# Patient Record
Sex: Female | Born: 1965 | Race: Asian | Hispanic: No | Marital: Married | Smoking: Never smoker
Health system: Southern US, Community
[De-identification: ages and names within clinical notes are randomized; demographics above are authoritative.]

## PROBLEM LIST (undated history)

## (undated) ENCOUNTER — Emergency Department (HOSPITAL_COMMUNITY): Admission: EM | Discharge status: Absent without leave | Payer: 59

## (undated) DIAGNOSIS — K219 Gastro-esophageal reflux disease without esophagitis: Secondary | ICD-10-CM

## (undated) DIAGNOSIS — E0590 Thyrotoxicosis, unspecified without thyrotoxic crisis or storm: Secondary | ICD-10-CM

## (undated) DIAGNOSIS — E039 Hypothyroidism, unspecified: Secondary | ICD-10-CM

## (undated) DIAGNOSIS — G43909 Migraine, unspecified, not intractable, without status migrainosus: Secondary | ICD-10-CM

## (undated) DIAGNOSIS — C73 Malignant neoplasm of thyroid gland: Secondary | ICD-10-CM

## (undated) DIAGNOSIS — Z5189 Encounter for other specified aftercare: Secondary | ICD-10-CM

## (undated) DIAGNOSIS — R768 Other specified abnormal immunological findings in serum: Secondary | ICD-10-CM

## (undated) HISTORY — PX: THYROIDECTOMY: SHX17

## (undated) HISTORY — DX: Other specified abnormal immunological findings in serum: R76.8

## (undated) HISTORY — DX: Malignant neoplasm of thyroid gland: C73

---

## 1998-04-20 DIAGNOSIS — IMO0001 Reserved for inherently not codable concepts without codable children: Secondary | ICD-10-CM

## 1998-04-20 DIAGNOSIS — Z5189 Encounter for other specified aftercare: Secondary | ICD-10-CM

## 1998-04-20 HISTORY — DX: Encounter for other specified aftercare: Z51.89

## 1998-04-20 HISTORY — DX: Reserved for inherently not codable concepts without codable children: IMO0001

## 1999-11-06 ENCOUNTER — Encounter: Payer: Self-pay | Admitting: Infectious Diseases

## 1999-11-06 ENCOUNTER — Ambulatory Visit (HOSPITAL_COMMUNITY): Admission: RE | Admit: 1999-11-06 | Discharge: 1999-11-06 | Payer: Self-pay | Admitting: Infectious Diseases

## 2000-02-24 ENCOUNTER — Other Ambulatory Visit: Admission: RE | Admit: 2000-02-24 | Discharge: 2000-02-24 | Payer: Self-pay | Admitting: Internal Medicine

## 2000-03-12 ENCOUNTER — Emergency Department (HOSPITAL_COMMUNITY): Admission: EM | Admit: 2000-03-12 | Discharge: 2000-03-13 | Payer: Self-pay | Admitting: Emergency Medicine

## 2000-03-23 ENCOUNTER — Encounter: Payer: Self-pay | Admitting: Obstetrics and Gynecology

## 2000-03-23 ENCOUNTER — Encounter: Admission: RE | Admit: 2000-03-23 | Discharge: 2000-03-23 | Payer: Self-pay | Admitting: Obstetrics and Gynecology

## 2000-04-20 HISTORY — PX: DILATION AND CURETTAGE, DIAGNOSTIC / THERAPEUTIC: SUR384

## 2000-04-20 HISTORY — PX: MYOMECTOMY: SHX85

## 2000-05-06 ENCOUNTER — Ambulatory Visit (HOSPITAL_COMMUNITY): Admission: RE | Admit: 2000-05-06 | Discharge: 2000-05-06 | Payer: Self-pay | Admitting: Obstetrics and Gynecology

## 2000-05-06 ENCOUNTER — Encounter (INDEPENDENT_AMBULATORY_CARE_PROVIDER_SITE_OTHER): Payer: Self-pay | Admitting: Specialist

## 2001-11-17 ENCOUNTER — Other Ambulatory Visit: Admission: RE | Admit: 2001-11-17 | Discharge: 2001-11-17 | Payer: Self-pay | Admitting: Obstetrics and Gynecology

## 2002-11-16 ENCOUNTER — Other Ambulatory Visit: Admission: RE | Admit: 2002-11-16 | Discharge: 2002-11-16 | Payer: Self-pay | Admitting: Obstetrics and Gynecology

## 2003-03-08 ENCOUNTER — Encounter: Admission: RE | Admit: 2003-03-08 | Discharge: 2003-03-08 | Payer: Self-pay | Admitting: Family Medicine

## 2003-08-06 ENCOUNTER — Encounter (HOSPITAL_COMMUNITY): Admission: RE | Admit: 2003-08-06 | Discharge: 2003-11-04 | Payer: Self-pay | Admitting: Endocrinology

## 2003-12-05 ENCOUNTER — Other Ambulatory Visit: Admission: RE | Admit: 2003-12-05 | Discharge: 2003-12-05 | Payer: Self-pay | Admitting: Obstetrics and Gynecology

## 2004-04-20 DIAGNOSIS — C73 Malignant neoplasm of thyroid gland: Secondary | ICD-10-CM

## 2004-04-20 HISTORY — DX: Malignant neoplasm of thyroid gland: C73

## 2004-08-13 ENCOUNTER — Ambulatory Visit (HOSPITAL_COMMUNITY): Admission: RE | Admit: 2004-08-13 | Discharge: 2004-08-13 | Payer: Self-pay | Admitting: Endocrinology

## 2004-09-10 ENCOUNTER — Encounter: Admission: RE | Admit: 2004-09-10 | Discharge: 2004-09-10 | Payer: Self-pay | Admitting: Endocrinology

## 2004-09-10 ENCOUNTER — Other Ambulatory Visit: Admission: RE | Admit: 2004-09-10 | Discharge: 2004-09-10 | Payer: Self-pay | Admitting: Interventional Radiology

## 2004-09-10 ENCOUNTER — Encounter (INDEPENDENT_AMBULATORY_CARE_PROVIDER_SITE_OTHER): Payer: Self-pay | Admitting: Specialist

## 2004-10-20 ENCOUNTER — Encounter (INDEPENDENT_AMBULATORY_CARE_PROVIDER_SITE_OTHER): Payer: Self-pay | Admitting: *Deleted

## 2004-10-20 ENCOUNTER — Ambulatory Visit (HOSPITAL_COMMUNITY): Admission: RE | Admit: 2004-10-20 | Discharge: 2004-10-22 | Payer: Self-pay | Admitting: Surgery

## 2004-11-17 ENCOUNTER — Encounter (HOSPITAL_COMMUNITY): Admission: RE | Admit: 2004-11-17 | Discharge: 2005-02-15 | Payer: Self-pay | Admitting: Endocrinology

## 2004-12-11 ENCOUNTER — Other Ambulatory Visit: Admission: RE | Admit: 2004-12-11 | Discharge: 2004-12-11 | Payer: Self-pay | Admitting: Obstetrics and Gynecology

## 2005-12-28 ENCOUNTER — Encounter (HOSPITAL_COMMUNITY): Admission: RE | Admit: 2005-12-28 | Discharge: 2006-03-28 | Payer: Self-pay | Admitting: Endocrinology

## 2006-01-19 ENCOUNTER — Other Ambulatory Visit: Admission: RE | Admit: 2006-01-19 | Discharge: 2006-01-19 | Payer: Self-pay | Admitting: Obstetrics and Gynecology

## 2006-08-19 IMAGING — CR DG CHEST 2V
2 series · 2 of 2 positions shown · non-contrast
Comparison: none

CLINICAL DATA: Papillary thyroid cancer.  Staging and pre-op respiratory evaluation.
 CHEST - 2 VIEW: 
 The heart size and mediastinal contours are normal.  The lungs are clear.  The visualized skeleton is unremarkable.

[view not recorded (1 of 2)]
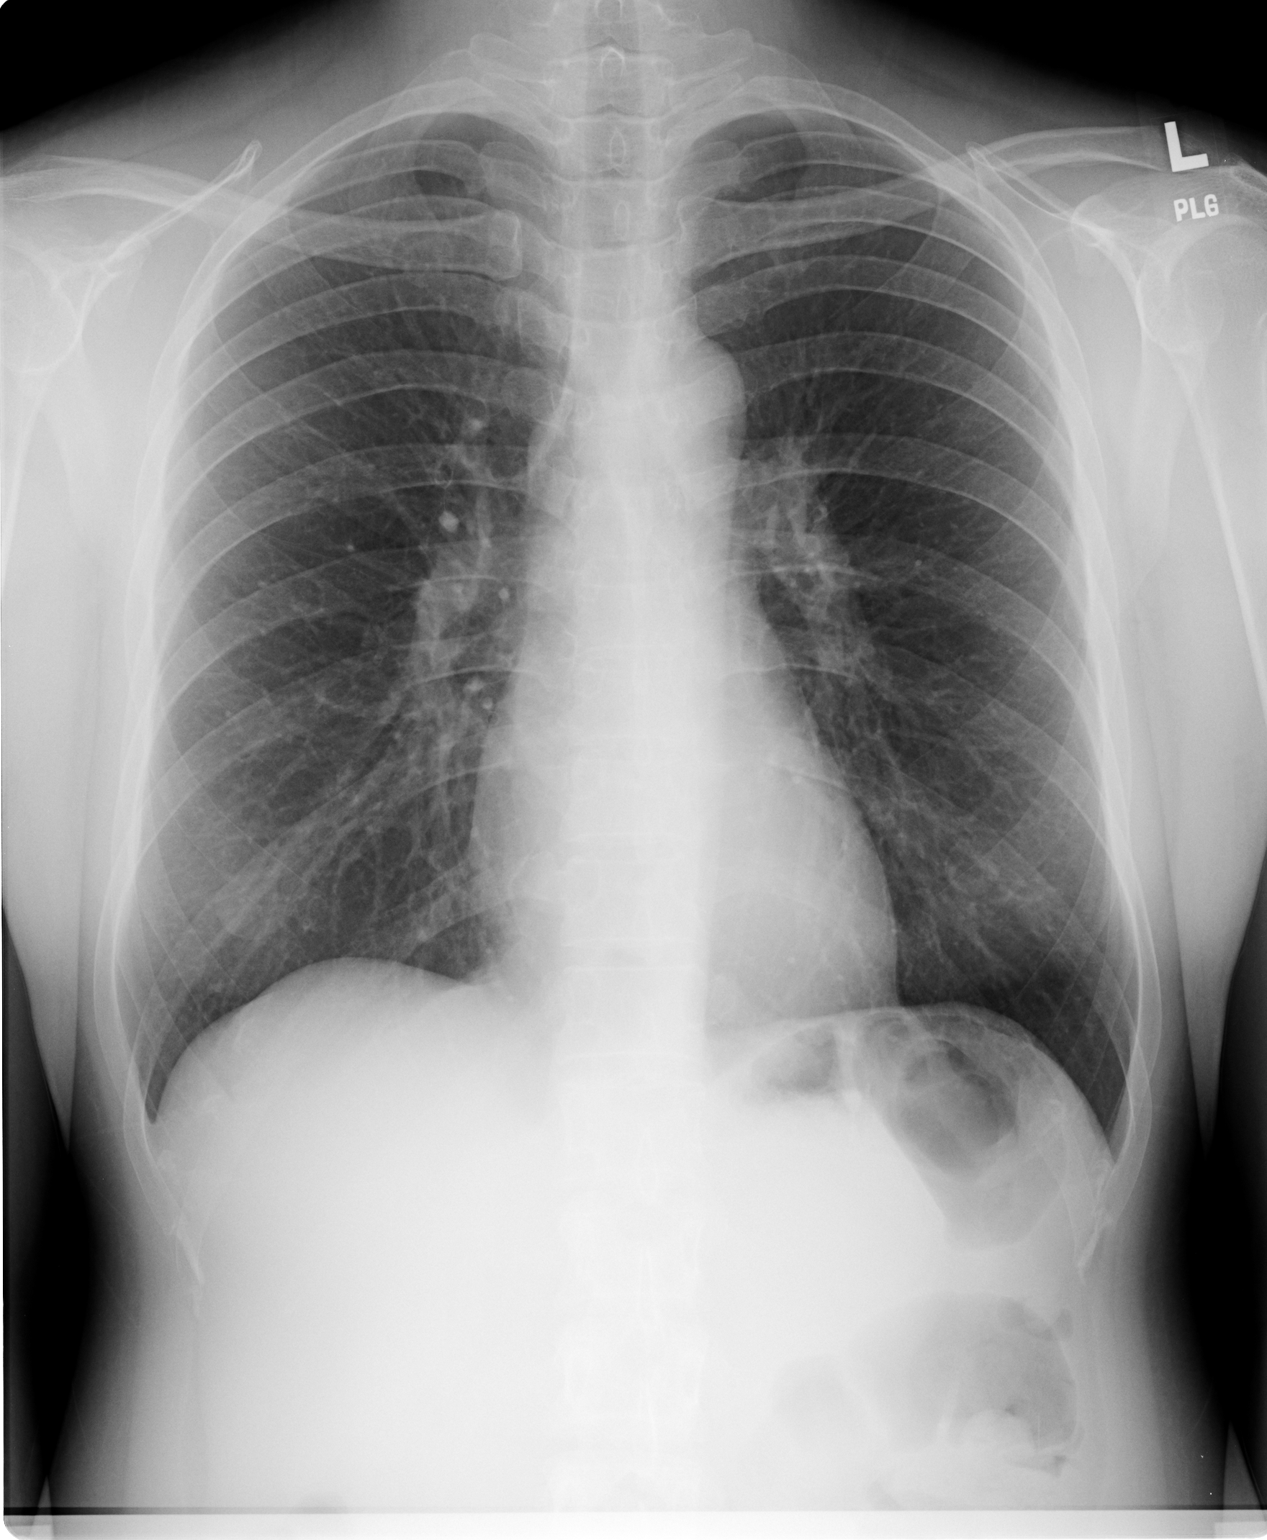

[view not recorded (2 of 2)]
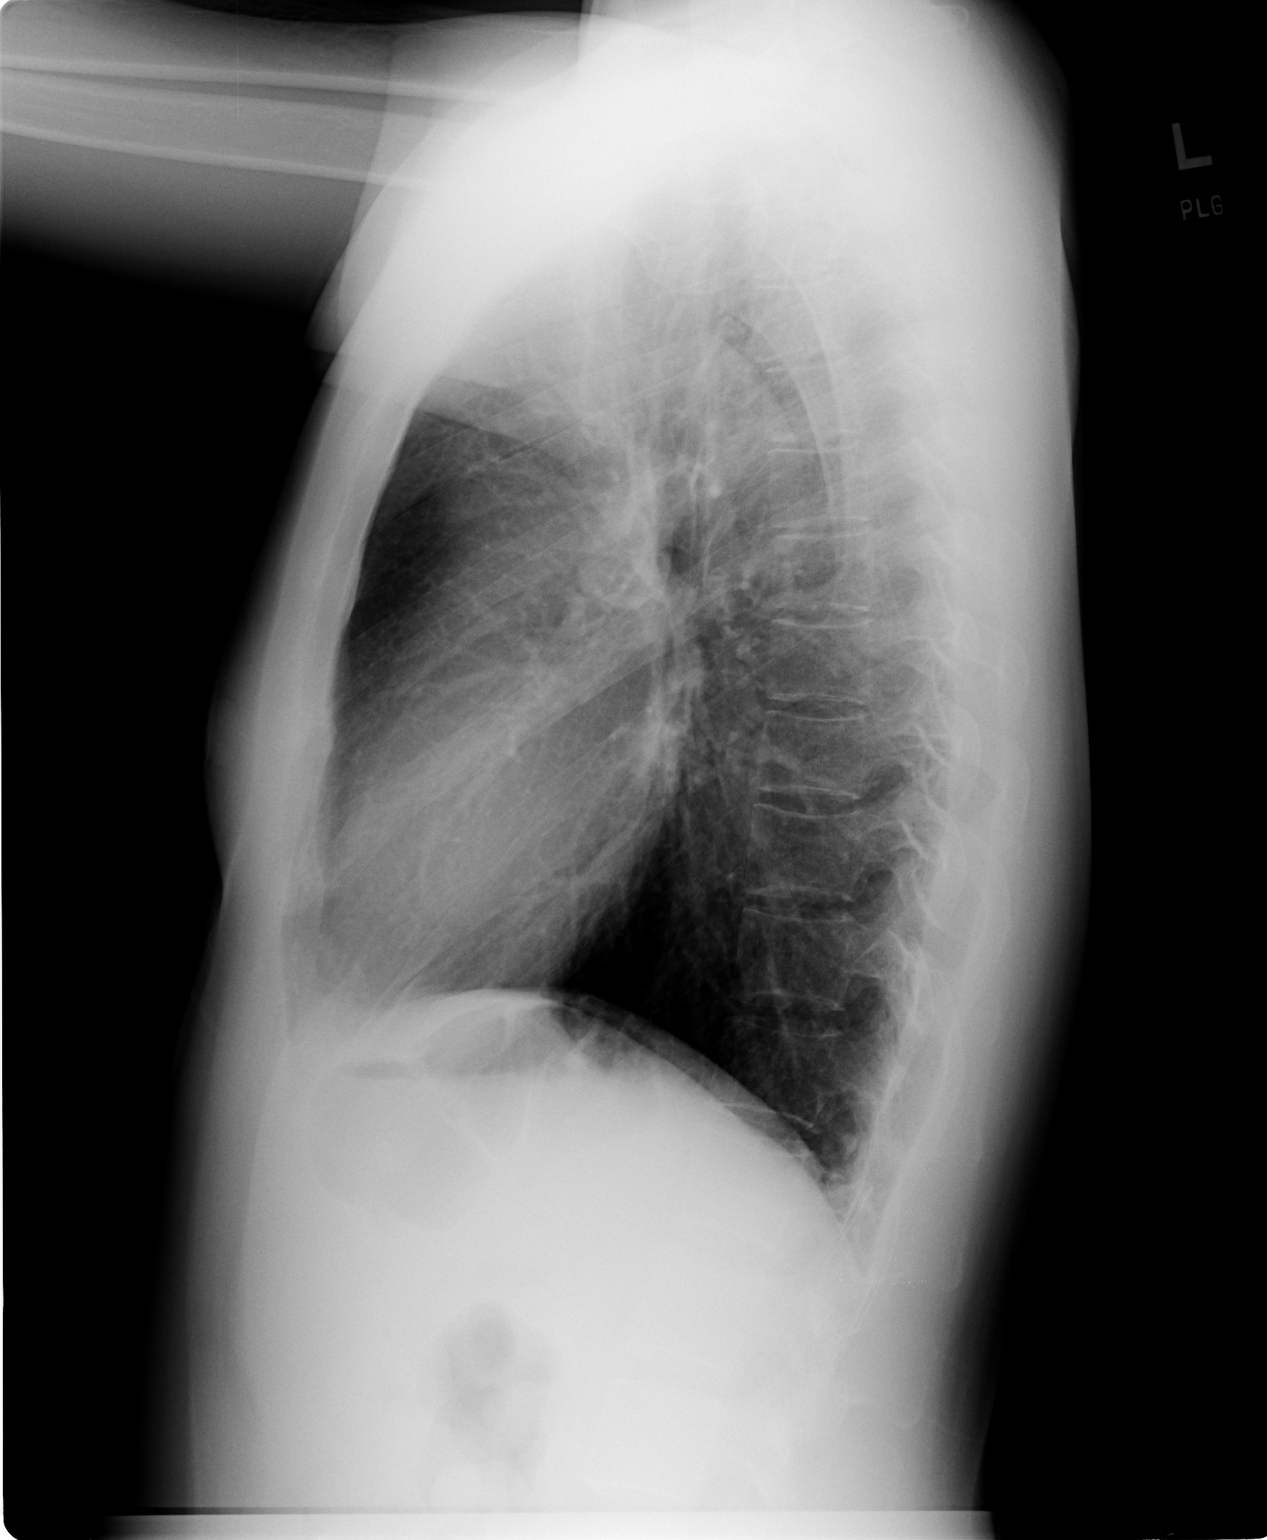

[2 of 2 positions shown; findings below may reference images not displayed]

IMPRESSION: No active disease.

## 2007-01-24 ENCOUNTER — Other Ambulatory Visit: Admission: RE | Admit: 2007-01-24 | Discharge: 2007-01-24 | Payer: Self-pay | Admitting: Obstetrics and Gynecology

## 2007-02-16 ENCOUNTER — Encounter (INDEPENDENT_AMBULATORY_CARE_PROVIDER_SITE_OTHER): Payer: Self-pay | Admitting: Obstetrics and Gynecology

## 2007-02-16 ENCOUNTER — Ambulatory Visit (HOSPITAL_COMMUNITY): Admission: RE | Admit: 2007-02-16 | Discharge: 2007-02-16 | Payer: Self-pay | Admitting: Obstetrics and Gynecology

## 2007-04-21 HISTORY — PX: BREAST LUMPECTOMY: SHX2

## 2007-07-08 ENCOUNTER — Encounter: Admission: RE | Admit: 2007-07-08 | Discharge: 2007-07-08 | Payer: Self-pay | Admitting: Family Medicine

## 2007-08-29 ENCOUNTER — Ambulatory Visit (HOSPITAL_BASED_OUTPATIENT_CLINIC_OR_DEPARTMENT_OTHER): Admission: RE | Admit: 2007-08-29 | Discharge: 2007-08-29 | Payer: Self-pay | Admitting: General Surgery

## 2007-08-29 ENCOUNTER — Encounter (HOSPITAL_BASED_OUTPATIENT_CLINIC_OR_DEPARTMENT_OTHER): Payer: Self-pay | Admitting: General Surgery

## 2007-09-30 ENCOUNTER — Emergency Department (HOSPITAL_COMMUNITY): Admission: EM | Admit: 2007-09-30 | Discharge: 2007-09-30 | Payer: Self-pay | Admitting: Emergency Medicine

## 2007-11-11 ENCOUNTER — Ambulatory Visit (HOSPITAL_COMMUNITY): Admission: RE | Admit: 2007-11-11 | Discharge: 2007-11-11 | Payer: Self-pay | Admitting: Obstetrics & Gynecology

## 2007-11-11 ENCOUNTER — Encounter (INDEPENDENT_AMBULATORY_CARE_PROVIDER_SITE_OTHER): Payer: Self-pay | Admitting: Obstetrics & Gynecology

## 2008-02-15 ENCOUNTER — Encounter: Admission: RE | Admit: 2008-02-15 | Discharge: 2008-02-15 | Payer: Self-pay | Admitting: General Surgery

## 2009-02-26 ENCOUNTER — Encounter: Admission: RE | Admit: 2009-02-26 | Discharge: 2009-02-26 | Payer: Self-pay | Admitting: Family Medicine

## 2009-03-08 ENCOUNTER — Encounter: Admission: RE | Admit: 2009-03-08 | Discharge: 2009-03-08 | Payer: Self-pay | Admitting: Family Medicine

## 2009-10-03 ENCOUNTER — Ambulatory Visit (HOSPITAL_BASED_OUTPATIENT_CLINIC_OR_DEPARTMENT_OTHER): Admission: RE | Admit: 2009-10-03 | Discharge: 2009-10-03 | Payer: Self-pay | Admitting: Surgery

## 2009-11-04 ENCOUNTER — Encounter (HOSPITAL_COMMUNITY): Admission: RE | Admit: 2009-11-04 | Discharge: 2010-01-07 | Payer: Self-pay | Admitting: Endocrinology

## 2009-11-10 ENCOUNTER — Emergency Department (HOSPITAL_BASED_OUTPATIENT_CLINIC_OR_DEPARTMENT_OTHER): Admission: EM | Admit: 2009-11-10 | Discharge: 2009-11-10 | Payer: Self-pay | Admitting: Emergency Medicine

## 2010-05-11 ENCOUNTER — Encounter: Payer: Self-pay | Admitting: Endocrinology

## 2010-06-24 ENCOUNTER — Other Ambulatory Visit: Payer: Self-pay | Admitting: Gynecology

## 2010-07-05 LAB — CBC
HCT: 34 % — ABNORMAL LOW (ref 36.0–46.0)
MCHC: 32.6 g/dL (ref 30.0–36.0)
Platelets: 362 10*3/uL (ref 150–400)
RDW: 14.2 % (ref 11.5–15.5)

## 2010-07-05 LAB — PREGNANCY, URINE: Preg Test, Ur: NEGATIVE

## 2010-07-05 LAB — URINALYSIS, ROUTINE W REFLEX MICROSCOPIC
Ketones, ur: NEGATIVE mg/dL
pH: 5.5 (ref 5.0–8.0)

## 2010-07-05 LAB — BASIC METABOLIC PANEL
BUN: 7 mg/dL (ref 6–23)
CO2: 25 mEq/L (ref 19–32)
GFR calc Af Amer: >60 mL/min (ref >60)
GFR calc non Af Amer: >60 mL/min (ref >60)
Glucose, Bld: 120 mg/dL — ABNORMAL HIGH (ref 70–99)

## 2010-07-05 LAB — HCG, SERUM, QUALITATIVE

## 2010-09-02 NOTE — Op Note (Signed)
Jasmine [name], Jasmine [name]             ACCOUNT NO.:  [id number]   MEDICAL RECORD NO.:  [id number]          PATIENT TYPE:  AMB   LOCATION:  SDC                           [facility]   PHYSICIAN:  Ilda Mori, M.D.   DATE OF BIRTH:  10-15-1965   DATE OF PROCEDURE:  11/11/2007  DATE OF DISCHARGE:                               OPERATIVE REPORT   PREOPERATIVE DIAGNOSIS:  Missed abortion fetal demise at 9 weeks.   POSTOPERATIVE DIAGNOSIS:  Missed abortion fetal demise at 9 weeks.   PROCEDURE:  Dilatation and evacuation.   SURGEON:  Ilda Mori, MD   ANESTHESIA:  MAC with paracervical block.   FINDINGS:  Products of conception compatible with missed AB and old  hemorrhage.   SPECIMEN:  To pathology and to [facility] for chromosomal  analysis of a portion of the POC.   COMPLICATIONS:  None.   BLOOD LOSS:  Minimal.   INDICATIONS:  This is a 45 year old primigravid female who had a  ultrasound showing a viable fetus 10 days prior to admission.  Three  days prior to mission, the patient returned with a history of some  bleeding and a followup ultrasound revealed a fetal demise with no fetal  heart tones seen.  The patient returns this morning for confirmatory  ultrasound and for dilatation evacuation.  An ultrasound done this  morning confirmed a fetal demise and the patient elected to proceed with  dilatation and evacuation.   PROCEDURE:  The patient was brought to the operating room and IV  sedation was administered.  She was placed in a dorsal lithotomy  position.  The vulva and vagina were prepped and draped in sterile  fashion.  The anterior lip of the cervix was grasped with a single-tooth  tenaculum and 10 mL of 2% lidocaine was infiltrated in the paracervical  tissues.  The internal os was dilated with Shawnie Pons dilators to a 30-  [place].  A #9 suction curette was introduced into the endometrial cavity  and the products of conception were evacuated.  Portion of the  products  of conception were sent for chromosomal analysis.  Procedure was then  terminated and the patient left the operating room in good condition.      Ilda Mori, M.D.  Electronically Signed     RK/MEDQ  D:  11/11/2007  T:  11/11/2007  Job:  [id number]

## 2010-09-02 NOTE — Op Note (Signed)
Jasmine [name], Jasmine [name]             ACCOUNT NO.:  [id number]   MEDICAL RECORD NO.:  [id number]          PATIENT TYPE:  AMB   LOCATION:  DSC                          FACILITY:  [facility]   PHYSICIAN:  Leonie Man, M.D.   DATE OF BIRTH:  26-Nov-1965   DATE OF PROCEDURE:  08/29/2007  DATE OF DISCHARGE:                               OPERATIVE REPORT   PREOPERATIVE DIAGNOSIS:  Fibroadenoma of the left breast.   POSTOPERATIVE DIAGNOSIS:  Fibroadenoma of the left breast.   PROCEDURE:  Excisional biopsy of left breast mass.   SURGEON:  Leonie Man, M.D.   ASSISTANT:  OR nurse.   ANESTHESIA:  General.   Ms. Gambino is a 45 year old female with a self-discovered left breast  mass located at approximately two fingerbreadths superior to the areolar  border on the left side.  This is imaged on ultrasound and is consistent  with the fibroadenoma.  The patient comes to the operating room now  after risks and potential benefits of surgery have been fully discussed.  All questions answered and consent obtained.   PROCEDURE:  Following the induction of satisfactory general anesthesia,  the left breast was prepped and draped to be included in the sterile  operative field.  Positive identification of the patient, [name], and the procedure be done, excisional biopsy of the left  breast was carried out.  I made a circumareolar incision along the  superior border of the areola deepened through the skin and subcutaneous  tissue carrying the dissection superiorly and medially towards the  region of the mass maintaining hemostasis.  The mass was grasped and  dissected free from the surrounding breast tissues.  This comprised of  cluster of approximately four small fibroadenomas.  This was removed in  its entirety and forwarded for pathologic evaluation.  Hemostasis was  secured with electrocautery.  Sponge and instrument counts were  verified.  The breast tissues were reapproximated with  3-0 Vicryl.  Subcutaneous tissues were closed with 3-0 Vicryl sutures.  Skin was  closed with 5-0 Monocryl suture and reinforced with Steri-Strips.  Sterile dressings were applied.  Anesthetic reversed.  The patient  removed from the operating room to the recovery room in stable  condition.  She tolerated the procedure well.      Leonie Man, M.D.  Electronically Signed     PB/MEDQ  D:  08/29/2007  T:  08/29/2007  Job:  [id number]

## 2010-09-02 NOTE — Op Note (Signed)
[name], [name]             ACCOUNT NO.:  [id number]   MEDICAL RECORD NO.:  [id number]          PATIENT TYPE:  AMB   LOCATION:  SDC                           [facility]   PHYSICIAN:  James A. Ashley Royalty, M.D.DATE OF BIRTH:  September 25, 1965   DATE OF PROCEDURE:  02/16/2007  DATE OF DISCHARGE:                               OPERATIVE REPORT   PREOPERATIVE DIAGNOSIS:  1. Endometrial polyp and/or submucosal fibroid.  2. Menorrhagia.   POSTOPERATIVE DIAGNOSIS:  Apparent submucosal fibroids (3).   PROCEDURE:  1. Diagnostic/operative hysteroscopy with resectoscopic myomectomies      (3).  2. Dilatation and curettage.   SURGEON:  Rudy Jew. Ashley Royalty, MD.   ANESTHESIA:  General.   ESTIMATED BLOOD LOSS:  50 mL, deficit 150 mL.   COMPLICATIONS:  None.   PACKS AND DRAINS:  None.   DESCRIPTION OF PROCEDURE:  The patient was taken to the operating room  and placed in the dorsal supine position.  After general anesthesia was  administered, she was placed in the lithotomy position, prepped and  draped in the usual manner for vaginal surgery. A posterior weighted  retractor was placed per vagina.  The anterior lip of the cervix was  grasped with a single-tooth tenacula.  The uterus was gently sounded to  approximately 10 cm and noted to be slightly retroverted.  The cervix  was then dilated to a size 27-French using Shawnie Pons dilators.  The  resectoscope was placed into the uterine cavity using sorbitol as a  distention medium.  After appropriate distention was obtained, the  uterine cavity was completely visualized.  There was a pedunculated  fibroid arising from the fundus slightly to the right of midline. In  addition, there was a fundal left-sided sessile submucosal fibroid.  Finally, there was a very small posterior submucosal fibroid slightly  inferior to the left side of the lateral one but still in the uterine  corpus.  The fallopian tube ostia were visualized bilaterally.  Appropriate photos were obtained.  Using the resectoscope, the left-  sided sessile fibroid was set in a piecemeal fashion until the contour  was continuous with the remainder of the uterine cavity.  Hemostasis was  obtained with the coagulation waveform.  Next the fundal pedunculated  fibroid was easily excised with the cutting waveform. It was submitted  separately to pathology for histologic studies.  Finally the small  posterior submucosal fibroid was excised and submitted in the same  container as the pieces from the left-sided fibroid.  Hemostasis was  obtained with the coagulation waveform.   Attention was then turned to the uterine curettage.  A medium-size  curette was introduced into the uterine cavity.  First a four quadrant  curettage was performed.  Then a therapeutic curettage was performed.  All curettings were submitted to pathology separately for histologic  studies.   At this point, the patient was felt to have benefited maximally from the  surgical procedure.  The vaginal instruments were removed.  There was a  small laceration at the tenaculum site on the cervix which was easily  closed with 2-0 chromic in  a figure-of-eight fashion.  Hemostasis was  noted and the procedure terminated.   The patient tolerated the procedure extremely well and was returned to  the recovery room in good condition.      James A. Ashley Royalty, M.D.  Electronically Signed     JAM/MEDQ  D:  02/16/2007  T:  02/17/2007  Job:  [id number]

## 2010-09-02 NOTE — H&P (Signed)
Jasmine [name], Jasmine [name]            ACCOUNT NO.:  [id number]   MEDICAL RECORD NO.:  [id number]           PATIENT TYPE:   LOCATION:                                [facility]   PHYSICIAN:  James A. Ashley Royalty, M.D.DATE OF BIRTH:  March 05, 1966   DATE OF ADMISSION:  DATE OF DISCHARGE:                              HISTORY & PHYSICAL   This is a 45 year old nulligravida who presented to me January 24, 2007  complaining of menorrhagia.  Pelvic examination revealed a nodular  uterus.  She subsequently underwent an ultrasound and sonohystogram.  The ultrasound performed February 10, 2007 revealed the uterus to be 9 x  7 x 6 cm with several small fibroids the largest of which were 2.8 and  2.5 cm in greatest diameter, respectively.  Adnexal areas were  unremarkable.  Sonohystogram was performed and revealed an apparent 1.8  cm polyp and an approximately 1.2 cm fibroid which appeared to have a  submucosal component.  She is scheduled for diagnostic/operative  hysteroscopy and dilatation and curettage.   MEDICATIONS:  Mircette, Synthroid 150 mcg daily.   PAST MEDICAL HISTORY:  1. History of thyroid cancer for which she received thyroidectomy and      radioactive iodine.  2. Secondary hypothyroidism.  3. Menstrual migraines.   SURGICAL HISTORY:  As above.   ALLERGIES:  None.   FAMILY HISTORY:  Noncontributory.   SOCIAL HISTORY:  The patient denies use of tobacco or significant  alcohol.   REVIEW OF SYSTEMS:  Noncontributory.   PHYSICAL EXAMINATION:  GENERAL:  Well-developed, well-nourished pleasant  white female in no acute distress.  VITAL SIGNS:  Afebrile, vital signs stable.  CHEST/LUNGS:  Clear.  CARDIAC:  Regular rate and rhythm.  BREASTS:  Exam deferred.  ABDOMEN:  Soft, nontender.  MUSCULOSKELETAL:  Reveals no edema.  PELVIC:  Please see most recent office evaluation.  External genitalia  within normal limits.  Vagina and cervix are without mass or lesions.  Bimanual  examination revealed uterus to be approximately 8 weeks size  and slightly nodular.  Rectovaginal examination confirmed.   IMPRESSION:  1. Fibroid uterus.  2. Possible endometrial polyp.  3. Menorrhagia - possibly secondary to #1 and/or #2.  4. History of thyroid cancer.  5. Secondary hypothyroidism.   PLAN:  1. Diagnostic/operative hysteroscopy.  2. Dilatation and curettage.  Risks, benefits, complications, and      alternatives were discussed with the patient.  She states she      understands and accepts.  Questions invited and answered.      James A. Ashley Royalty, M.D.  Electronically Signed     JAM/MEDQ  D:  02/16/2007  T:  02/16/2007  Job:  [id number]

## 2010-09-05 NOTE — H&P (Signed)
[facility]  Patient:    Jasmine [name], Jasmine [name]                    MRN: [id number] Proc. Date: 05/06/00 Adm. Date:  [id number] Attending:  Wandalee Ferdinand                         History and Physical  HISTORY:                      This is a 45 year old nulligravida recently referred to me for evaluation of abnormal uterine bleeding.  She was first given oral contraceptives, in an effort to control her abnormal bleeding.  She then went away to the [place] the [place], and stopped taking the birth control pills.  Unfortunately she had an episode of significant bleeding, for which she was transfused in the [place]  She returns today for a diagnostic/operative hysteroscopy, and a D&C.  CURRENT MEDICATIONS:          Allegra p.r.n.  PAST MEDICAL HISTORY:         Migraines.  PAST SURGICAL HISTORY:        Negative.  ALLERGIES:                    No known drug allergies.  FAMILY HISTORY:               Negative for uterine cancer.  Positive for hypertension.  SOCIAL HISTORY:               The patient denies the use of tobacco or significant alcohol.  PHYSICAL EXAMINATION:  GENERAL:                      A well-developed, well-nourished pleasant white female, in no acute distress.  VITAL SIGNS:                  Afebrile, vital signs stable.  SKIN:                         Warm and dry without lesions.  NODES:                        There is no supraclavicular, cervical, or inguinal adenopathy.  HEENT:                        Normocephalic.  NECK:                         Supple without thyromegaly.  CHEST:                        Lungs are clear.  HEART:                        Regular rate and rhythm, without murmurs, gallops, or rubs.  BREASTS:                      Deferred.  ABDOMEN:                      Soft, nontender, without masses or organomegaly. Bowel sounds are active.  MUSCULOSKELETAL:  Reveals a full  range of motion without edema, cyanosis, or CVA tenderness.  PELVIC:                       Deferred until examination under anesthesia.  ACCESSORY CLINICAL FINDINGS:  An ultrasound on March 23, 2000, revealed a fibroid uterus.  The largest fibroid was 2.2 cm in the greatest diameter.  IMPRESSION:                   1. Fibroid uterus.                               2. Abnormal uterine bleeding, refractory                                  to medical management.  PLAN:                         1. Diagnostic/operative hysteroscopy.                               2. Dilatation and curettage.  The risks, benefits, and complications, and alternatives were fully discussed with the patient.  She states she understands and accepts.  Her questions were invited and answered. DD:  05/06/00 TD:  05/06/00 Job: [id number]

## 2010-09-05 NOTE — Discharge Summary (Signed)
Jasmine [name], Jasmine [name]             ACCOUNT NO.:  [id number]   MEDICAL RECORD NO.:  [id number]          PATIENT TYPE:  OIB   LOCATION:  5734                         FACILITY:  [facility]   PHYSICIAN:  Velora Heckler, MD      DATE OF BIRTH:  12-08-1965   DATE OF ADMISSION:  10/20/2004  DATE OF DISCHARGE:  10/22/2004                                 DISCHARGE SUMMARY   REASON FOR ADMISSION:  Thyroid nodule   HISTORY OF PRESENT ILLNESS:  The patient is 45 year old Asian female  presents with thyroid nodule.  Ultrasound demonstrated a 2.4 cm solid mass  in the right thyroid lobe.  Fine needle aspiration was worrisome for  papillary thyroid carcinoma.  The patient now comes to surgery for  resection.   HOSPITAL COURSE:  The patient the patient was admitted to the hospital  July3,2006.  She was taken directly to the operating room where she  underwent total thyroidectomy.  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  Postoperatively the patient's course was complicated by  hypocalcemia.  She received intravenous calcium gluconate as well as oral  calcium carbonate and vitamin D derivatives.  The patient's calcium  stabilized on the second postoperative day and she was prepared for  discharge.   DISCHARGE/PLAN:  The patient is discharged home today October 22, 2004 in good  condition, tolerating a regular diet, ambulating independently.  The patient  has no paresthesias or muscle spasms.   DISCHARGE MEDICATIONS:  Darvocet for pain, calcium carbonate with vitamin D  2500 mg t.i.d. and Rocaltrol 0.25 mcg daily.   DISPOSITION:  The patient will be seen back in my office at [facility]  [facility]gery with a follow-up calcium level in 2-3 weeks.  The patient will be  seen by Dr. Dorisann Frames at [place] [facility] 2-3 weeks for  evaluation for radioactive iodine treatment.   FINAL DIAGNOSIS:  Papillary thyroid carcinoma.   CONDITION ON DISCHARGE:  Condition at discharge is  good.       TMG/MEDQ  D:  10/22/2004  T:  10/22/2004  Job:  [id number]   cc:   Dorisann Frames, M.D.  [address] [place] [address]  Fax: [phone]   Velora Heckler, MD  [phone] [address] [place]  [place] [address]

## 2010-09-05 NOTE — Op Note (Signed)
Jasmine [name], Jasmine [name]             ACCOUNT NO.:  [id number]   MEDICAL RECORD NO.:  [id number]          PATIENT TYPE:  OIB   LOCATION:  2550                         FACILITY:  [facility]   PHYSICIAN:  Velora Heckler, MD      DATE OF BIRTH:  02/15/1966   DATE OF PROCEDURE:  10/20/2004  DATE OF DISCHARGE:                                 OPERATIVE REPORT   PREOPERATIVE DIAGNOSIS:  Right thyroid nodule.   POSTOPERATIVE DIAGNOSIS:  Papillary thyroid carcinoma.   PROCEDURE:  Total thyroidectomy.   SURGEON:  Velora Heckler, M.D.   ASSISTANT:  Harriette Bouillon, MD   ANESTHESIA:  General per Dr. Kipp Brood.   ESTIMATED BLOOD LOSS:  Minimal.   PREPARATION:  Betadine.   COMPLICATIONS:  None.   INDICATIONS:  The patient is a 45 year old Asian female who presented with  thyroid nodule. She was found on ultrasound to have a 2.4 cm solid mass in  the right thyroid gland. In May of 2006 she underwent fine needle  aspiration. This demonstrated follicular epithelial cells with nuclear  changes worrisome for papillary thyroid carcinoma. The patient now comes to  surgery for resection.   BODY OF REPORT:  The procedure was don in OR #1 at the [facility]. The patient was brought to the operating room, placed in  supine position on the operating room table. Following administration of  general anesthesia, the patient was positioned and then prepped and draped  in usual strict aseptic fashion. After ascertaining that an adequate level  of anesthesia been obtained, a Kocher incision was made a #15 blade.  Dissection was carried down through subcutaneous tissues and platysma. Skin  flaps were elevated cephalad and caudad from the thyroid notch to the  sternal notch. A Mahorner self-retaining retractor was placed for exposure.  Strap muscles were incised in the midline. Dissection is begun on the left  neck. Strap muscles are reflected laterally. Left thyroid lobe was exposed.  It  was examined. It was grossly normal.   Next we turned our attention to the right thyroid lobe. Again strap muscles  were reflected laterally. There was a nodular mass measuring approximately 2  cm in the upper portion of the right thyroid lobe. Overlying strap muscles  were adherent to the mass. Due to the risk of malignancy, some of the strap  muscle was resected on the surface of the gland. Gland was mobilized. Venous  tributaries were divided between small and medium Ligaclips. Superior pole  was dissected out. Superior pole was ligated in continuity with 2-0 silk  ties and medium Ligaclips and divided. Gland is rolled anteriorly. Superior  parathyroid gland was identified and preserved on its vascular pedicle.  Inferior thyroid artery was divided between medium Ligaclips. The inferior  venous tributaries were ligated between medium Ligaclips. Gland was rolled  anteriorly. Recurrent laryngeal nerve was identified on the right side and  preserved. Branches of the inferior thyroid artery were divided between  small Ligaclips. Gland is rolled further anteriorly and the ligament of  Allyson Sabal was transected with electrocautery. Gland is rolled up  and across the  trachea. Isthmus was completely mobilized. The gland was transected to the  left of the midline between hemostats. Specimen was passed off the field and  submitted to pathology. The isthmus was suture ligated with 3-0 Vicryl  suture ligatures. Frozen section by Dr. Star Age confirms papillary  thyroid carcinoma.   Decision was made to proceed with total thyroidectomy. Therefore the left  thyroid lobe was again exposed. Venous tributaries were divided between  small and medium Ligaclips. Superior pole vessels were dissected out,  ligated in continuity with 2-0 silk ties and medium Ligaclips and divided.  Gland is rolled anteriorly. Again the superior parathyroid gland was  identified. It was preserved on its vascular pedicle. Inferior  venous  tributaries were ligated in continuity with 2-0 silk ties and divided. Gland  is rolled anteriorly. Recurrent laryngeal nerve was identified and  preserved. Ligament of Allyson Sabal was transected with electrocautery. Venous  tributaries are cauterized with electrocautery. Gland was rolled up and onto  the anterior trachea from which it was excised with electrocautery. There  was a small pyramidal lobe which was also dissected out with electrocautery  and included with the specimen. Good hemostasis was noted. Surgicel was  placed over the area of the recurrent laryngeal nerves bilaterally. Strap  muscles were reapproximated midline with interrupted 3-0 Vicryl sutures.  Platysma was closed with interrupted 3-0 Vicryl sutures. Skin edges were  reapproximated running 4-0 Vicryl subcuticular suture. Wound was washed and  dried. Benzoin, Steri-Strips were applied. Sterile dressings were applied.  The patient was awakened from anesthesia and brought to the recovery room in  stable condition. The patient tolerated the procedure well.       TMG/MEDQ  D:  10/20/2004  T:  10/20/2004  Job:  [id number]   cc:   Dorisann Frames, M.D.  [address] [place] [address]  Fax: [phone]   Maryla Morrow. Modesto Charon, M.D.  [address]  [place]  [place] [address]  Fax: [phone]

## 2010-09-05 NOTE — Op Note (Signed)
[facility]  Patient:    Jasmine [name], Jasmine [name]                    MRN: [id number] Proc. Date: 05/06/00 Adm. Date:  [id number] Disc. Date: [id number] Attending:  Wandalee Ferdinand                           Operative Report  PREOPERATIVE DIAGNOSES:       1. Fibroid uterus.                               2. Abnormal uterine bleeding--refractory to                                  medical management.                               3. History of anemia from abnormal uterine                                  bleeding.  POSTOPERATIVE DIAGNOSES:      1. Fibroid uterus.                               2. Abnormal uterine bleeding--refractory to                                  medical management.                               3. History of anemia from abnormal uterine                                  bleeding.  PROCEDURE:                    1. Diagnostic/operative laparoscopy with                                  hysteroscopy with myomectomy.                               2. Dilatation and curettage.  SURGEON:                      Rudy Jew. Ashley Royalty, M.D.  ANESTHESIA:                   General.  ESTIMATED BLOOD LOSS:         Less than 150 cc.  COMPLICATIONS:                None.  PACKS AND DRAINS:             None.  DESCRIPTION OF PROCEDURE:     Patient was taken to the operating room and placed in the dorsal supine position. After adequate general  anesthesia was administered, she was placed in lithotomy position and prepped and draped in the usual manner for vaginal surgery. Posterior weighted retractor was placed per vagina. Anterior lip of the cervix was grasped with a single-tooth tenaculum. Interestingly, the cervix was noted to be already dilated to at least a size 29 [place]. Resectoscope was placed in the uterine cavity and distention accomplished with Sorbitol. The uterine cavity was sterilely inspected. Both tubal ostia were noted. There was a  small submucosal fundal fibroid. There was also a small anterior fibroid on the patients left side. The most prominent finding was a rather huge posterior fibroid in the lower uterine segment which was felt by the operator to be almost certainly the source of the patients abnormal bleeding.  Using the resectoscope, at 80 watts cutting and 70 watts coagulation wave form, the first two superior fibroids were easily undermined and removed, at least the submucosal components. Then, the large posterior inferior fibroid was removed in a piecemeal fashion and submitted to pathology for histologic studies. Hemostasis was obtained with a coagulation wave form.  Attention was then turned to the curettage. A medium-size curet was introduced into the uterine cavity. Four quadrant curettage was performed. Then, a therapeutic curettage was performed. Specimen was submitted to pathology separately for histologic studies. The resectoscope was once again placed into the uterine cavity. Any additional small bleeders were easily controlled with the coagulation wave form. At this point, hemostasis was felt to be adequate. The resectoscope was removed. Two small tenaculum lacerations on the cervix were repaired with 3-0 chromic in interrupted fashion. Hemostasis was felt satisfactory and the patient sent to the recovery room in good condition.DD:  05/06/00 TD:  05/07/00 Job: [id number]

## 2010-10-17 ENCOUNTER — Encounter (HOSPITAL_COMMUNITY): Payer: Self-pay

## 2010-10-17 ENCOUNTER — Encounter (HOSPITAL_COMMUNITY)
Admission: RE | Admit: 2010-10-17 | Discharge: 2010-10-17 | Disposition: A | Discharge status: Home / Self Care | Payer: 59 | Source: Ambulatory Visit | Attending: Obstetrics and Gynecology | Admitting: Obstetrics and Gynecology

## 2010-10-17 HISTORY — DX: Thyrotoxicosis, unspecified without thyrotoxic crisis or storm: E05.90

## 2010-10-17 HISTORY — DX: Encounter for other specified aftercare: Z51.89

## 2010-10-17 LAB — CBC
HCT: 39.8 % (ref 36.0–46.0)
Hemoglobin: 13.1 g/dL (ref 12.0–15.0)
MCH: 29.6 pg (ref 26.0–34.0)
MCV: 89.8 fL (ref 78.0–100.0)
Platelets: 347 10*3/uL (ref 150–400)
RBC: 4.43 MIL/uL (ref 3.87–5.11)
WBC: 6 10*3/uL (ref 4.0–10.5)

## 2010-10-17 NOTE — Patient Instructions (Addendum)
20 TEMIA DEBROUX  10/17/2010   Your procedure is scheduled on:  10/27/2010  Report to [facility] at 6 AM.  Call this number if you have problems the morning of surgery: [phone]   Remember:   Do not eat food:After Midnight.  Do not drink clear liquids: After Midnight.  Take these medicines the morning of surgery with A SIP OF WATER: per anesthesia   Do not wear jewelry, make-up or nail polish.  Do not bring valuables to the hospital.  Contacts, dentures or bridgework may not be worn into surgery.  Leave suitcase in the car. After surgery it may be brought to your room.  For patients admitted to the hospital, checkout time is 11:00 AM the day of discharge.   Patients discharged the day of surgery will not be allowed to drive home.  Name and phone number of your driver: Alinda Money, cell [phone]  Special Instructions:    Please read over the following fact sheets that you were given: Pain Booklet and MRSA Information

## 2010-10-26 MED ORDER — DEXTROSE 5 % IV SOLN
2.0000 g | Freq: Once | INTRAVENOUS | Status: DC
  Filled 2010-10-26: qty 2

## 2010-10-27 ENCOUNTER — Inpatient Hospital Stay (HOSPITAL_COMMUNITY)
Admission: RE | Admit: 2010-10-27 | Discharge: 2010-10-29 | DRG: 743 | Disposition: A | Discharge status: Home Health | Payer: 59 | Source: Ambulatory Visit | Attending: Obstetrics and Gynecology | Admitting: Obstetrics and Gynecology

## 2010-10-27 ENCOUNTER — Encounter (HOSPITAL_COMMUNITY): Payer: Self-pay | Admitting: Certified Registered"

## 2010-10-27 ENCOUNTER — Ambulatory Visit (HOSPITAL_COMMUNITY): Payer: 59 | Admitting: Certified Registered"

## 2010-10-27 ENCOUNTER — Encounter (HOSPITAL_COMMUNITY)
Admission: RE | Disposition: A | Discharge status: Home Health | Payer: Self-pay | Source: Ambulatory Visit | Attending: Obstetrics and Gynecology

## 2010-10-27 ENCOUNTER — Other Ambulatory Visit: Payer: Self-pay | Admitting: Obstetrics and Gynecology

## 2010-10-27 DIAGNOSIS — N8000 Endometriosis of the uterus, unspecified: Secondary | ICD-10-CM | POA: Diagnosis present

## 2010-10-27 DIAGNOSIS — D251 Intramural leiomyoma of uterus: Principal | ICD-10-CM | POA: Diagnosis present

## 2010-10-27 DIAGNOSIS — D252 Subserosal leiomyoma of uterus: Secondary | ICD-10-CM | POA: Diagnosis present

## 2010-10-27 HISTORY — PX: ABDOMINAL HYSTERECTOMY: SHX81

## 2010-10-27 LAB — PREGNANCY, URINE: Preg Test, Ur: NEGATIVE

## 2010-10-27 LAB — TYPE AND SCREEN
ABO/RH(D): A POS
Antibody Screen: NEGATIVE

## 2010-10-27 SURGERY — HYSTERECTOMY, ABDOMINAL
Anesthesia: General | Site: Abdomen | Wound class: Clean Contaminated

## 2010-10-27 MED ORDER — ACETAMINOPHEN 325 MG PO TABS: 325.0000 mg | ORAL_TABLET | ORAL | Status: DC | PRN

## 2010-10-27 MED ORDER — ACETAMINOPHEN 10 MG/ML IV SOLN
1000.0000 mg | Freq: Once | INTRAVENOUS | Status: DC | PRN
  Filled 2010-10-27: qty 100

## 2010-10-27 MED ORDER — GLYCOPYRROLATE 0.2 MG/ML IJ SOLN
INTRAMUSCULAR | Status: DC | PRN
  Administered 2010-10-27: .5 mg via INTRAVENOUS

## 2010-10-27 MED ORDER — NEOSTIGMINE METHYLSULFATE 1 MG/ML IJ SOLN
INTRAMUSCULAR | Status: DC | PRN
  Administered 2010-10-27: 3 mg via INTRAMUSCULAR

## 2010-10-27 MED ORDER — HYDROMORPHONE 0.3 MG/ML IV SOLN
INTRAVENOUS | Status: AC
  Filled 2010-10-27: qty 25

## 2010-10-27 MED ORDER — ONDANSETRON HCL 4 MG/2ML IJ SOLN
INTRAMUSCULAR | Status: DC | PRN
  Administered 2010-10-27: 4 mg via INTRAMUSCULAR

## 2010-10-27 MED ORDER — PROPOFOL 10 MG/ML IV EMUL
INTRAVENOUS | Status: DC | PRN
  Administered 2010-10-27: 150 mg via INTRAVENOUS

## 2010-10-27 MED ORDER — PROMETHAZINE HCL 25 MG/ML IJ SOLN: 6.2500 mg | INTRAMUSCULAR | Status: DC | PRN

## 2010-10-27 MED ORDER — LACTATED RINGERS IV SOLN
INTRAVENOUS | Status: DC
  Administered 2010-10-27: 07:00:00 via INTRAVENOUS

## 2010-10-27 MED ORDER — MEPERIDINE HCL 25 MG/ML IJ SOLN: 6.2500 mg | INTRAMUSCULAR | Status: DC | PRN

## 2010-10-27 MED ORDER — MIDAZOLAM HCL 5 MG/5ML IJ SOLN
INTRAMUSCULAR | Status: DC | PRN
  Administered 2010-10-27: 2 mg via INTRAVENOUS

## 2010-10-27 MED ORDER — ONDANSETRON HCL 4 MG PO TABS: 4.0000 mg | ORAL_TABLET | Freq: Four times a day (QID) | ORAL | Status: DC | PRN

## 2010-10-27 MED ORDER — DEXTROSE IN LACTATED RINGERS 5 % IV SOLN
INTRAVENOUS | Status: DC
  Administered 2010-10-27: 19:00:00 via INTRAVENOUS

## 2010-10-27 MED ORDER — LEVOTHYROXINE SODIUM 137 MCG PO TABS: 137.0000 ug | ORAL_TABLET | ORAL | Status: DC

## 2010-10-27 MED ORDER — MORPHINE SULFATE 10 MG/ML IJ SOLN
INTRAMUSCULAR | Status: DC | PRN
  Administered 2010-10-27 (×2): 5 mg via INTRAVENOUS

## 2010-10-27 MED ORDER — DOCUSATE SODIUM 100 MG PO CAPS
100.0000 mg | ORAL_CAPSULE | Freq: Every day | ORAL | Status: DC
  Administered 2010-10-29: 100 mg via ORAL
  Filled 2010-10-27: qty 1

## 2010-10-27 MED ORDER — KETOROLAC TROMETHAMINE 30 MG/ML IJ SOLN
INTRAMUSCULAR | Status: DC | PRN
  Administered 2010-10-27: 30 mg via INTRAVENOUS

## 2010-10-27 MED ORDER — OXYCODONE-ACETAMINOPHEN 5-325 MG PO TABS
1.0000 | ORAL_TABLET | ORAL | Status: DC | PRN
  Administered 2010-10-28: 1 via ORAL
  Filled 2010-10-27: qty 1

## 2010-10-27 MED ORDER — LACTATED RINGERS IV SOLN
INTRAVENOUS | Status: DC | PRN
  Administered 2010-10-27 (×2): via INTRAVENOUS

## 2010-10-27 MED ORDER — ROCURONIUM BROMIDE 100 MG/10ML IV SOLN
INTRAVENOUS | Status: DC | PRN
  Administered 2010-10-27: 35 mg via INTRAVENOUS

## 2010-10-27 MED ORDER — DEXAMETHASONE SODIUM PHOSPHATE 4 MG/ML IJ SOLN
INTRAMUSCULAR | Status: DC | PRN
  Administered 2010-10-27: 4 mg via INTRAVENOUS

## 2010-10-27 MED ORDER — MENTHOL 3 MG MT LOZG: 1.0000 | LOZENGE | OROMUCOSAL | Status: DC | PRN

## 2010-10-27 MED ORDER — LEVOTHYROXINE SODIUM 150 MCG PO TABS
150.0000 ug | ORAL_TABLET | ORAL | Status: DC
  Filled 2010-10-27 (×3): qty 1

## 2010-10-27 MED ORDER — FENTANYL CITRATE 0.05 MG/ML IJ SOLN
INTRAMUSCULAR | Status: DC | PRN
  Administered 2010-10-27: 50 ug via INTRAVENOUS
  Administered 2010-10-27: 100 ug via INTRAVENOUS
  Administered 2010-10-27: 50 ug via INTRAVENOUS

## 2010-10-27 MED ORDER — HYDROMORPHONE 0.3 MG/ML IV SOLN
INTRAVENOUS | Status: DC
  Administered 2010-10-27: 12:00:00 via INTRAVENOUS

## 2010-10-27 MED ORDER — LIDOCAINE HCL (CARDIAC) 20 MG/ML IV SOLN
INTRAVENOUS | Status: DC | PRN
  Administered 2010-10-27: 60 mg via INTRAVENOUS

## 2010-10-27 MED ORDER — ONDANSETRON HCL 4 MG/2ML IJ SOLN
4.0000 mg | Freq: Four times a day (QID) | INTRAMUSCULAR | Status: DC | PRN
  Administered 2010-10-27 (×2): 4 mg via INTRAVENOUS
  Filled 2010-10-27 (×2): qty 2

## 2010-10-27 MED ORDER — SIMETHICONE 80 MG PO CHEW: 80.0000 mg | CHEWABLE_TABLET | Freq: Four times a day (QID) | ORAL | Status: DC | PRN

## 2010-10-27 MED ORDER — SODIUM CHLORIDE 0.9 % IR SOLN
Status: DC | PRN
  Administered 2010-10-27: 1000 mL

## 2010-10-27 MED ORDER — HYDROMORPHONE HCL 1 MG/ML IJ SOLN
0.2500 mg | INTRAMUSCULAR | Status: DC | PRN
  Administered 2010-10-27 (×4): 0.5 mg via INTRAVENOUS

## 2010-10-27 SURGICAL SUPPLY — 32 items
CANISTER SUCTION 2500CC (MISCELLANEOUS) ×2 IMPLANT
CHLORAPREP W/TINT 26ML (MISCELLANEOUS) ×2 IMPLANT
CLOTH BEACON ORANGE TIMEOUT ST (SAFETY) ×2 IMPLANT
CONT PATH 16OZ SNAP LID 3702 (MISCELLANEOUS) ×2 IMPLANT
DECANTER SPIKE VIAL GLASS SM (MISCELLANEOUS) IMPLANT
DRAPE UTILITY XL STRL (DRAPES) ×2 IMPLANT
FORCEPS CUTTING 33CM 5MM (CUTTING FORCEPS) IMPLANT
GLOVE BIO SURGEON STRL SZ 6.5 (GLOVE) ×2 IMPLANT
GLOVE BIOGEL PI IND STRL 7.0 (GLOVE) ×2 IMPLANT
GLOVE BIOGEL PI INDICATOR 7.0 (GLOVE) ×2
GOWN BRE IMP SLV AUR LG STRL (GOWN DISPOSABLE) ×6 IMPLANT
HEMOSTAT SURGICEL 4X8 (HEMOSTASIS) IMPLANT
NEEDLE HYPO 25X1 1.5 SAFETY (NEEDLE) IMPLANT
NS IRRIG 1000ML POUR BTL (IV SOLUTION) ×2 IMPLANT
PACK ABDOMINAL GYN (CUSTOM PROCEDURE TRAY) ×2 IMPLANT
PAD OB MATERNITY 4.3X12.25 (PERSONAL CARE ITEMS) ×2 IMPLANT
SPONGE LAP 18X18 X RAY DECT (DISPOSABLE) IMPLANT
STAPLER VISISTAT 35W (STAPLE) ×2 IMPLANT
SUT CHROMIC 2 0 TIES 18 (SUTURE) IMPLANT
SUT MON AB-0 CT1 36 (SUTURE) ×4 IMPLANT
SUT PDS AB 0 CTX 60 (SUTURE) ×2 IMPLANT
SUT PLAIN 2 0 (SUTURE)
SUT PLAIN 2 0 XLH (SUTURE) IMPLANT
SUT PLAIN ABS 2-0 54XMFL TIE (SUTURE) IMPLANT
SUT VIC AB 0 CT1 18XCR BRD8 (SUTURE) ×2 IMPLANT
SUT VIC AB 0 CT1 8-18 (SUTURE) ×2
SUT VIC AB 3-0 X1 27 (SUTURE) IMPLANT
SUT VICRYL 0 TIES 12 18 (SUTURE) ×2 IMPLANT
SYR CONTROL 10ML LL (SYRINGE) IMPLANT
TOWEL OR 17X24 6PK STRL BLUE (TOWEL DISPOSABLE) ×4 IMPLANT
TRAY FOLEY CATH 14FR (SET/KITS/TRAYS/PACK) ×2 IMPLANT
WATER STERILE IRR 1000ML POUR (IV SOLUTION) ×2 IMPLANT

## 2010-10-27 NOTE — Progress Notes (Deleted)
UR chart review completed.  

## 2010-10-27 NOTE — Progress Notes (Signed)
Day of Surgery Procedure(s) (LRB): HYSTERECTOMY ABDOMINAL (N/A)  Subjective: Patient reports tolerating PO.    Objective: I have reviewed patient's vital signs and intake and output.  General: alert and cooperative Resp: clear to auscultation bilaterally Cardio: regular rate and rhythm GI: soft, non-tender; bowel sounds normal; no masses,  no organomegaly and incision: clean  Assessment: s/p Procedure(s): HYSTERECTOMY ABDOMINAL: stable  Plan: Advance diet routine postop care  LOS: 0 days    Jasmine [name] 10/27/2010, 5:22 PM

## 2010-10-27 NOTE — Anesthesia Postprocedure Evaluation (Signed)
Immediate Anesthesia Transfer of Care Note  Patient: Jasmine [name]  Procedure(s) Performed:  HYSTERECTOMY ABDOMINAL  Patient Location: PACU  Anesthesia Type: [name]  Level of Consciousness: awake and alert   Airway & Oxygen Therapy: Patient Spontanous Breathing and Patient connected to nasal cannula oxygen  Post-op Assessment: Report given to PACU RN and Post -op Vital signs reviewed and stable  Post vital signs: Reviewed and stable  Complications: No apparent anesthesia complications

## 2010-10-27 NOTE — Anesthesia Preprocedure Evaluation (Addendum)
Anesthesia Evaluation  Name, MR# and DOB Patient awake  General Assessment Comment  Reviewed: Allergy & Precautions, H&P , Patient's Chart, lab work & pertinent test results and reviewed documented beta blocker date and time   History of Anesthesia Complications Negative for: history of anesthetic complications  Airway Mallampati: II TM Distance: >3 FB Neck ROM: full    Dental No notable dental hx    Pulmonaryneg pulmonary ROS      pulmonary exam normal   Cardiovascular Exercise Tolerance: Good regular Normal   Neuro/PsychNegative Neurological ROS Negative Psych ROS  GI/Hepatic/Renal negative GI ROS, negative Liver ROS, and negative Renal ROS (+)       Endo/Other  Negative Endocrine ROS (+)  hypothyroid Abdominal   Musculoskeletal  Hematology negative hematology ROS (+)   Peds  Reproductive/Obstetrics negative OB ROS          Anesthesia Physical Anesthesia Plan  ASA: I  Anesthesia Plan: General   Post-op Pain Management:    Induction:   Airway Management Planned:   Additional Equipment:   Intra-op Plan:   Post-operative Plan:   Informed Consent: I have reviewed the patients History and Physical, chart, labs and discussed the procedure including the risks, benefits and alternatives for the proposed anesthesia with the patient or authorized representative who has indicated his/her understanding and acceptance.   Dental Advisory Given  Plan Discussed with: CRNA and Surgeon  Anesthesia Plan Comments:         Anesthesia Quick Evaluation

## 2010-10-27 NOTE — Op Note (Signed)
Hysterectomy Procedure Note  Indications: fibroids  Pre-operative Diagnosis: fibroids  Post-operative Diagnosis: fibroids  Operation: Total abdominal hysterectomy TAH  Surgeon: Billiejo Sorto   Assistants: Rana Snare  Anesthesia: general    Findings: Enlarged fibroid uterus  Estimated Blood Loss:  150         Drains: foley, uop 250cc, clear         Total IV Fluids:         Specimens: uterus w/ cervix                  Complications"None; patient tolerated the procedure well."}         Disposition: PACU - hemodynamically stable.         Condition: stable  Attending Attestation: I was present and scrubbed for the entire procedure.

## 2010-10-27 NOTE — Transfer of Care (Signed)
  Anesthesia Post-op Note  Patient: Jasmine [name]  Procedure(s) Performed:  HYSTERECTOMY ABDOMINAL  Patient's cardiopulmonary status is stable Patient's level of consciousness: sedate but responsive verbally Pain and nausea are all reasonably controlled No anesthetic complications apparent at this time No follow up care necessary at this time

## 2010-10-27 NOTE — H&P (Signed)
Jasmine [name], Jasmine [name]             ACCOUNT NO.:  [id number]  MEDICAL RECORD NO.:  [id number]  LOCATION:                                 FACILITY:  PHYSICIAN:  Zelphia Cairo, MD    DATE OF BIRTH:  September 26, 1965  DATE OF ADMISSION:  10/27/2010 DATE OF DISCHARGE:                             HISTORY & PHYSICAL   A 45 year old with symptomatic fibroids, presents today for surgical management.  PAST MEDICAL HISTORY: 1. Hypothyroidism. 2. Fibroids.  PAST SURGICAL HISTORY: 1. Thyroidectomy. 2. Myomectomy.  FAMILY HISTORY:  Arthritis.  SOCIAL HISTORY:  Negative for tobacco, alcohol, and drug use.  DRUG ALLERGIES:  None.  MEDICATIONS:  Synthroid, multivitamins, calcium, and vitamin C.  PHYSICAL EXAMINATION:  VITAL SIGNS:  Afebrile, vital signs stable. GENERAL:  She is in no acute distress. HEART:  Regular rate and rhythm. LUNGS:  Clear bilaterally. ABDOMEN:  Soft, nontender, nondistended.  No rebound or guarding. PELVIC:  A 16-18 weeks' size fibroid uterus. EXTREMITIES:  Without clubbing, cyanosis, or edema.  Pelvic ultrasound shows multiple uterine fibroids, the largest measuring 6.7 cm.  IMPRESSION:  Fibroid uterus, symptomatic.  PLAN:  Abdominal hysterectomy.  Risks, benefits, and alternatives discussed with the patient.  Informed consent obtained.     Zelphia Cairo, MD     GA/MEDQ  D:  10/26/2010  T:  10/27/2010  Job:  [id number]

## 2010-10-28 LAB — CBC
MCHC: 32.3 g/dL (ref 30.0–36.0)
RDW: 13.1 % (ref 11.5–15.5)

## 2010-10-28 MED ORDER — SODIUM CHLORIDE 0.9 % IJ SOLN: 3.0000 mL | Freq: Two times a day (BID) | INTRAMUSCULAR | Status: DC

## 2010-10-28 MED ORDER — HYDROMORPHONE 0.3 MG/ML IV SOLN
INTRAVENOUS | Status: AC
  Filled 2010-10-28: qty 25

## 2010-10-28 MED ORDER — HYDROMORPHONE HCL 2 MG PO TABS
2.0000 mg | ORAL_TABLET | ORAL | Status: DC | PRN
  Administered 2010-10-28 – 2010-10-29 (×3): 2 mg via ORAL
  Filled 2010-10-28 (×3): qty 1

## 2010-10-28 MED ORDER — IBUPROFEN 600 MG PO TABS
600.0000 mg | ORAL_TABLET | Freq: Four times a day (QID) | ORAL | Status: DC | PRN
  Administered 2010-10-28 – 2010-10-29 (×5): 600 mg via ORAL
  Filled 2010-10-28 (×5): qty 1

## 2010-10-28 NOTE — Op Note (Signed)
Hysterectomy Procedure Note  Procedure Details  The patient was seen in the Holding Room. The risks, benefits, complications, treatment options, and expected outcomes were discussed with the patient.  The patient concurred with the proposed plan, giving informed consent.  The site of surgery properly noted/marked. The patient was taken to Operating Room # 4, identified as [facility] and the procedure verified as Total abdominal hysterectomy. A Time Out was held and the above information confirmed.  After induction of anesthesia, the patient was draped and prepped in the usual sterile manner. Pt was placed in supine position after anesthesia and draped and prepped in the usual sterile manner. Foley catheter was placed.  A transverse incision was made and carried through the subcutaneous tissue to the fascia. Fascial incision was made and extended with curved mayo sissors. The rectus muscles were separated. The peritoneum was identified and entered. Peritoneal incision was extended longitudinally.  A 16-18 week size uterus was noted and delivered through the incision.  The bowel was packed away from the surgical site.   The round ligaments were identified, cut, and ligated with 0-Vicryl. The anterior peritoneal reflection was incised and the bladder was dissected off the lower uterine segment. The retroperitoneal space was explored and the ureters were identified bilaterally. The right utero-ovarian ligament and proximal fallopian tube were grasped, cut, and suture ligated with 0-Vicryl. The left utero-ovarian ligament and proximal fallopian tube were grasped, cut and suture ligated with 0-Vicryl. Hemostasis  was observed. The uterine vessels were skeletonized, then clamped, cut and suture ligated with 0-Vicryl suture. Serial pedicles of the cardinal and utero-sacral ligaments were clamped, cut, and suture ligated with 0-Vicryl. Entrance was made into the vagina and the uterus removed. Vaginal cuff  angle sutures were placed incorporating the utero-sacral ligaments for support. The vaginal cuff was then closed with an interrupted stitch of 0- Vicryl. Lavage was carried out until clear. Hemostasis was observed.   All packing was removed from the abdomen. Peritoneum was reapproximated with 0 Monocril. The fascia was approximated with running sutures of 0-PDSl. Lavage was again carried out. Hemostasis was observed. The skin was approximated with staples.  Instrument, sponge, and needle counts were correct prior to abdominal closure and at the conclusion of the case.

## 2010-10-28 NOTE — Progress Notes (Signed)
1 Day Post-Op Procedure(s) (LRB): HYSTERECTOMY ABDOMINAL (N/A)  Subjective: Patient reports nausea.    Objective: I have reviewed patient's vital signs and labs.  General: alert and no distress Resp: clear to auscultation bilaterally GI: soft, NT/ND.  decreased BS Extremities: extremities normal, atraumatic, no cyanosis or edema  Assessment: s/p Procedure(s): HYSTERECTOMY ABDOMINAL:   Plan: Advance diet Encourage ambulation Advance to PO medication  LOS: 1 day    Jasmine [name] 10/28/2010, 7:50 AM

## 2010-10-28 NOTE — Progress Notes (Signed)
UR Chart review completed.  

## 2010-10-29 ENCOUNTER — Encounter (HOSPITAL_COMMUNITY): Payer: Self-pay | Admitting: *Deleted

## 2010-10-29 MED ORDER — IBUPROFEN 600 MG PO TABS: 600.0000 mg | ORAL_TABLET | Freq: Four times a day (QID) | ORAL | Status: AC | PRN

## 2010-10-29 MED ORDER — HYDROMORPHONE HCL 2 MG PO TABS: 2.0000 mg | ORAL_TABLET | ORAL | Status: AC | PRN

## 2010-10-29 NOTE — Discharge Summary (Signed)
Physician Discharge Summary  Patient ID: Jasmine [name] MRN: [id number] DOB/AGE: 44/25/1967 44 y.o.  Admit date: 10/27/2010 Discharge date: 10/29/2010  Admission Diagnoses: fibroids Discharge Diagnoses: same Active Problems:  * No active hospital problems. *    Discharged Condition: stable  Hospital Course: Pt presented to the floor after leaving the PACU.  Please see operative note for further details of the surgery.  Her pain was initially controlled with IVPCA and a foley catheter was in place.  POD1, she was tolerating PO and PCA was discontinued and pain was controlled w/ PO meds.  She was ambulating without difficulty and foley catheter was removed.  Hgb and VS were stable throughout admission.  POD2, she was tolerating a regular diet, ambulating, and pain was controlled w/ PO meds.    Consults: none  Significant Diagnostic Studies: labs: CBC  Treatments: surgery: TAH  Discharge Exam: Blood pressure 115/76, pulse 69, temperature 98 F (36.7 C), temperature source Oral, resp. rate 18, height 5\' 4"  (1.626 m), weight 58.514 kg (129 lb), last menstrual period 09/30/2010, SpO2 99.00%. please see progress note  Disposition: stable for d/c home   Current Discharge Medication List    CONTINUE these medications which have NOT CHANGED   Details  CALCIUM CARBONATE-VIT D-MIN PO Take 1 tablet by mouth daily.      !! levothyroxine (SYNTHROID, LEVOTHROID) 137 MCG tablet Take 137 mcg by mouth 2 (two) times a week. Patient takes 1 tablet daily on Saturday and Sunday only.     !! levothyroxine (SYNTHROID, LEVOTHROID) 150 MCG tablet Take 150 mcg by mouth daily. Patient takes 1 tablet daily on Monday through Friday only      Multiple Vitamin (MULTIVITAMIN) tablet Take 1 tablet by mouth daily.       !! - Potential duplicate medications found. Please discuss with provider.    STOP taking these medications     aspirin-acetaminophen-caffeine (EXCEDRIN MIGRAINE) 250-250-65 MG per tablet            Signed: Yasiel Goyne 10/29/2010, 5:59 AM

## 2010-10-29 NOTE — Progress Notes (Signed)
Pt. accompanied outside by certified nursing assistant. Pt. education completed and prescriptions given.

## 2010-10-29 NOTE — Progress Notes (Signed)
2 Days Post-Op Procedure(s) (LRB): HYSTERECTOMY ABDOMINAL (N/A)  Subjective: Patient reports tolerating PO and no problems voiding.    Objective: I have reviewed patient's vital signs and labs.  General: alert, cooperative and no distress GI: soft, non-tender; bowel sounds normal; no masses,  no organomegaly and incision: clean, dry and intact Extremities: extremities normal, atraumatic, no cyanosis or edema  Assessment: s/p Procedure(s): HYSTERECTOMY ABDOMINAL: stable and tolerating diet  Plan: Discharge home  LOS: 2 days    Jasmine [name] 10/29/2010, 5:57 AM

## 2010-11-11 ENCOUNTER — Encounter (HOSPITAL_COMMUNITY): Payer: Self-pay | Admitting: Obstetrics and Gynecology

## 2011-01-16 LAB — CBC
HCT: 41.5
MCV: 93.8
RBC: 4.42
WBC: 7

## 2011-01-28 LAB — TYPE AND SCREEN: ABO/RH(D): A POS

## 2011-01-28 LAB — CBC
MCV: 89.9
RBC: 4.65
WBC: 5.7

## 2011-01-28 LAB — HCG, SERUM, QUALITATIVE: Preg, Serum: NEGATIVE

## 2011-12-18 ENCOUNTER — Encounter (HOSPITAL_COMMUNITY): Payer: Self-pay | Admitting: *Deleted

## 2011-12-18 ENCOUNTER — Emergency Department (HOSPITAL_COMMUNITY)
Admission: EM | Admit: 2011-12-18 | Discharge: 2011-12-18 | Disposition: A | Discharge status: Home / Self Care | Payer: 59 | Attending: Emergency Medicine | Admitting: Emergency Medicine

## 2011-12-18 DIAGNOSIS — R51 Headache: Secondary | ICD-10-CM | POA: Insufficient documentation

## 2011-12-18 DIAGNOSIS — R0789 Other chest pain: Secondary | ICD-10-CM

## 2011-12-18 DIAGNOSIS — R071 Chest pain on breathing: Secondary | ICD-10-CM | POA: Insufficient documentation

## 2011-12-18 DIAGNOSIS — Z7982 Long term (current) use of aspirin: Secondary | ICD-10-CM | POA: Insufficient documentation

## 2011-12-18 DIAGNOSIS — R61 Generalized hyperhidrosis: Secondary | ICD-10-CM | POA: Insufficient documentation

## 2011-12-18 DIAGNOSIS — R55 Syncope and collapse: Secondary | ICD-10-CM | POA: Insufficient documentation

## 2011-12-18 HISTORY — DX: Gastro-esophageal reflux disease without esophagitis: K21.9

## 2011-12-18 LAB — BASIC METABOLIC PANEL
BUN: 5 mg/dL — ABNORMAL LOW (ref 6–23)
CO2: 23 mEq/L (ref 19–32)
Chloride: 101 mEq/L (ref 96–112)
Glucose, Bld: 143 mg/dL — ABNORMAL HIGH (ref 70–99)
Potassium: 3.5 mEq/L (ref 3.5–5.1)
Sodium: 137 mEq/L (ref 135–145)

## 2011-12-18 LAB — CBC WITH DIFFERENTIAL/PLATELET
Basophils Absolute: 0 K/uL (ref 0.0–0.1)
Basophils Relative: 0 % (ref 0–1)
Eosinophils Absolute: 0 K/uL (ref 0.0–0.7)
Eosinophils Relative: 0 % (ref 0–5)
HCT: 44.9 % (ref 36.0–46.0)
Hemoglobin: 15.5 g/dL — ABNORMAL HIGH (ref 12.0–15.0)
Lymphocytes Relative: 22 % (ref 12–46)
Lymphs Abs: 1.1 K/uL (ref 0.7–4.0)
MCH: 30.2 pg (ref 26.0–34.0)
MCHC: 34.5 g/dL (ref 30.0–36.0)
MCV: 87.4 fL (ref 78.0–100.0)
Monocytes Absolute: 0.1 K/uL (ref 0.1–1.0)
Monocytes Relative: 3 % (ref 3–12)
Neutro Abs: 3.6 K/uL (ref 1.7–7.7)
Neutrophils Relative %: 75 % (ref 43–77)
Platelets: 292 K/uL (ref 150–400)
RBC: 5.14 MIL/uL — ABNORMAL HIGH (ref 3.87–5.11)
RDW: 12 % (ref 11.5–15.5)
WBC: 4.9 K/uL (ref 4.0–10.5)

## 2011-12-18 LAB — URINALYSIS, ROUTINE W REFLEX MICROSCOPIC
Bilirubin Urine: NEGATIVE
Glucose, UA: NEGATIVE mg/dL
Hgb urine dipstick: NEGATIVE
Leukocytes, UA: NEGATIVE
Nitrite: NEGATIVE
Protein, ur: NEGATIVE mg/dL
Specific Gravity, Urine: 1.014 (ref 1.005–1.030)
Urobilinogen, UA: 0.2 mg/dL (ref 0.0–1.0)
pH: 7 (ref 5.0–8.0)

## 2011-12-18 LAB — TROPONIN I: Troponin I: <0.3 ng/mL

## 2011-12-18 LAB — TSH: TSH: 0.81 u[IU]/mL (ref 0.350–4.500)

## 2011-12-18 LAB — D-DIMER, QUANTITATIVE: D-Dimer, Quant: 0.23 ug{FEU}/mL (ref 0.00–0.48)

## 2011-12-18 MED ORDER — SODIUM CHLORIDE 0.9 % IV BOLUS (SEPSIS)
1000.0000 mL | Freq: Once | INTRAVENOUS | Status: AC
  Administered 2011-12-18: 1000 mL via INTRAVENOUS

## 2011-12-18 MED ORDER — TRAMADOL HCL 50 MG PO TABS: 50.0000 mg | ORAL_TABLET | Freq: Four times a day (QID) | ORAL | Status: DC | PRN

## 2011-12-18 MED ORDER — GI COCKTAIL ~~LOC~~
30.0000 mL | Freq: Once | ORAL | Status: AC
  Administered 2011-12-18: 30 mL via ORAL
  Filled 2011-12-18: qty 30

## 2011-12-18 MED ORDER — KETOROLAC TROMETHAMINE 30 MG/ML IJ SOLN
30.0000 mg | Freq: Once | INTRAMUSCULAR | Status: AC
  Administered 2011-12-18: 30 mg via INTRAVENOUS
  Filled 2011-12-18: qty 1

## 2011-12-18 MED ORDER — METOCLOPRAMIDE HCL 5 MG/ML IJ SOLN
10.0000 mg | Freq: Once | INTRAMUSCULAR | Status: AC
  Administered 2011-12-18: 10 mg via INTRAVENOUS
  Filled 2011-12-18: qty 2

## 2011-12-18 NOTE — ED Notes (Signed)
Pt reports headache is typical of her migraines with pain to left side of face and left neck. Pt reports symptoms of tingling and numbness in both her feet and lips which is new. Pt denies issues with speech or thought process. Pt endorses sensitivity to light.  Pt able to ambulate without issue. Pt denies chest pain, shortness of breath or nausea.

## 2011-12-18 NOTE — ED Provider Notes (Signed)
History     CSN: [id number]  Arrival date & time 12/18/11  4098   First MD Initiated Contact with Patient 12/18/11 [phone]      Chief Complaint  Patient presents with  . Headache  . Dizziness    (Consider location/radiation/quality/duration/timing/severity/associated sxs/prior treatment) HPI Pt with history of migraine states she had gradual onset migraine yesterday and took 400 mg of Ibuprofen. Continued to have HA this AM with photophobia, took Excedrin and went to work. At work she became lightheaded and diaphoretic feeling she may pass out. HA has mostly resolved. No neck stiffness, fever, chills. No CP, SOB, cough, abd pain, N/V/D. Pt has had urinary frequency without dysuria  Past Medical History  Diagnosis Date  . Hyperthyroidism     thyrodectomy 2006  . Blood transfusion     2 units transfuesd 2000  . Headache     migraines - hormonal - last one 6/12 -OTC  . Acid reflux     Past Surgical History  Procedure Date  . Breast surgery     lumpectomy Left 2009  . Myomectomy     2010  . Dilatation and curettage     2009  . Total abdominal hysterectomy     10/27/10  . Abdominal hysterectomy 10/27/2010    Procedure: HYSTERECTOMY ABDOMINAL;  Surgeon: Zelphia Cairo;  Location: [facility];  Service: Gynecology;  Laterality: N/A;  . Thyroidectomy     No family history on file.  History  Substance Use Topics  . Smoking status: Never Smoker   . Smokeless tobacco: Not on file  . Alcohol Use: No    OB History    Grav Para Term Preterm Abortions TAB SAB Ect Mult Living                  Review of Systems  Constitutional: Positive for diaphoresis. Negative for fever and chills.  HENT: Negative for neck pain and neck stiffness.   Eyes: Positive for photophobia. Negative for visual disturbance.  Respiratory: Negative for cough, shortness of breath and wheezing.   Cardiovascular: Negative for chest pain, palpitations and leg swelling.  Gastrointestinal: Negative for nausea,  vomiting, abdominal pain and diarrhea.  Genitourinary: Positive for frequency. Negative for dysuria.  Musculoskeletal: Negative for myalgias and back pain.  Skin: Negative for rash.  Neurological: Positive for dizziness and headaches. Negative for weakness and numbness.    Allergies  Review of patient's allergies indicates no known allergies.  Home Medications   Current Outpatient Rx  Name Route Sig Dispense Refill  . ASPIRIN-ACETAMINOPHEN-CAFFEINE 250-250-65 MG PO TABS Oral Take 2 tablets by mouth every 6 (six) hours as needed. Headache    . CALCIUM CARBONATE-VIT D-MIN PO Oral Take 1 tablet by mouth daily.      Marland Kitchen ESOMEPRAZOLE MAGNESIUM 20 MG PO CPDR Oral Take 20 mg by mouth daily before breakfast.    . IBUPROFEN 200 MG PO TABS Oral Take 400 mg by mouth every 6 (six) hours as needed. Headache    . LEVOTHYROXINE SODIUM 137 MCG PO TABS Oral Take 137 mcg by mouth 2 (two) times a week. Patient takes 1 tablet daily on Saturday and Sunday only, 150 mcg mon-fri    . LEVOTHYROXINE SODIUM 150 MCG PO TABS Oral Take 150 mcg by mouth daily. Patient takes 1 tablet daily on Monday through Friday only      . ONE-DAILY MULTI VITAMINS PO TABS Oral Take 1 tablet by mouth daily.      . TRAMADOL HCL 50  MG PO TABS Oral Take 1 tablet (50 mg total) by mouth every 6 (six) hours as needed for pain. 15 tablet 0    BP 114/67  Pulse 70  Temp 98.5 F (36.9 C) (Oral)  Resp 16  SpO2 100%  LMP 09/30/2010  Physical Exam  Nursing note and vitals reviewed. Constitutional: She is oriented to person, place, and time. She appears well-developed and well-nourished. No distress.  HENT:  Head: Normocephalic and atraumatic.  Mouth/Throat: Oropharynx is clear and moist.  Eyes: EOM are normal. Pupils are equal, round, and reactive to light.  Neck: Normal range of motion. Neck supple.       No meningismus   Cardiovascular: Normal rate and regular rhythm.   Pulmonary/Chest: Effort normal and breath sounds normal. No  respiratory distress. She has no wheezes. She has no rales.  Abdominal: Soft. Bowel sounds are normal. She exhibits no mass. There is no tenderness. There is no rebound and no guarding.  Musculoskeletal: Normal range of motion. She exhibits tenderness (mild R rhomboid TTP. no evidence of trauma). She exhibits no edema.       No calf tenderness or swelling  Neurological: She is alert and oriented to person, place, and time.       5/5 motor, sensation intact  Skin: Skin is warm and dry. No rash noted. No erythema.  Psychiatric: She has a normal mood and affect. Her behavior is normal.    ED Course  Procedures (including critical care time)  Labs Reviewed  CBC WITH DIFFERENTIAL - Abnormal; Notable for the following:    RBC 5.14 (*)     Hemoglobin 15.5 (*)     All other components within normal limits  BASIC METABOLIC PANEL - Abnormal; Notable for the following:    Glucose, Bld 143 (*)     BUN 5 (*)     Calcium 8.3 (*)     All other components within normal limits  URINALYSIS, ROUTINE W REFLEX MICROSCOPIC - Abnormal; Notable for the following:    APPearance CLOUDY (*)     Ketones, ur TRACE (*)     All other components within normal limits  TROPONIN I  D-DIMER, QUANTITATIVE  TSH   No results found.   1. Headache   2. Near syncope   3. Chest wall pain      Date: 12/18/2011  Rate: 86  Rhythm: normal sinus rhythm  QRS Axis: normal  Intervals: normal  ST/T Wave abnormalities: nonspecific T wave changes  Conduction Disutrbances:none  Narrative Interpretation:   Old EKG Reviewed: unchanged    MDM  On reassessment, HA is resolved. HR is normalized. Pt voices concern for ongoing L chest pain being treated by PMD as costochondritis vs GERD. She had a negative CXR earlier in the week.  Pain is localized to left lower costal cartilage area. It is reproduced with palpation. Cardiac and PE unlikley. Normal Trop, EKG, and D-dimer. Will add ultram to meds and have f/u with PMD. Return  for worsening symptoms or concerns        Loren Racer, MD 12/18/11 1407

## 2011-12-18 NOTE — ED Notes (Signed)
[id number]<AV> Expected date:12/18/11<BR> Expected time: 8:30 AM<BR> Means of arrival:Ambulance<BR> Comments:<BR> 46 female Migraine

## 2011-12-18 NOTE — ED Notes (Addendum)
Per EMS.  Pt is from work.  Pt has history of migraines.  Migraine started last night. Pt tried taking ibuprofen this AM without improvement of pain.  Per EMS- patient anxious, diaphoretic and complained of feeling lightheaded and like she will pass out. EMS reports negative neuro exam.  Normal sinus rhythm on monitor.

## 2011-12-18 NOTE — ED Notes (Signed)
Pt reports history of migraine headaches. Pt reports headache started yesterday and took Excedrin migraine at [facility].  Pt reports headache has improved, but she is concerned for other new symptoms. Pt reports dizziness, diaphoresis and feeling faint.  Pt reports she was recently seen by PCP on Wednesday for symptoms of chest pain.  PCP, [facility], completed EKG and blood work.  Patient is unsure of results. Pt denies chest pain at this time.  Patient also has new onset of anemia.

## 2011-12-24 ENCOUNTER — Emergency Department (HOSPITAL_COMMUNITY)
Admission: EM | Admit: 2011-12-24 | Discharge: 2011-12-24 | Disposition: A | Discharge status: Other Acute Inpatient Hospital | Payer: 59 | Source: Home / Self Care | Attending: Emergency Medicine | Admitting: Emergency Medicine

## 2011-12-24 ENCOUNTER — Encounter (HOSPITAL_COMMUNITY): Payer: Self-pay | Admitting: *Deleted

## 2011-12-24 ENCOUNTER — Emergency Department (HOSPITAL_COMMUNITY)
Admission: EM | Admit: 2011-12-24 | Discharge: 2011-12-24 | Disposition: A | Discharge status: Home / Self Care | Payer: 59 | Attending: Emergency Medicine | Admitting: Emergency Medicine

## 2011-12-24 ENCOUNTER — Encounter (HOSPITAL_COMMUNITY): Payer: Self-pay | Admitting: Emergency Medicine

## 2011-12-24 ENCOUNTER — Emergency Department (HOSPITAL_COMMUNITY): Payer: 59

## 2011-12-24 DIAGNOSIS — R42 Dizziness and giddiness: Secondary | ICD-10-CM | POA: Insufficient documentation

## 2011-12-24 DIAGNOSIS — G43909 Migraine, unspecified, not intractable, without status migrainosus: Secondary | ICD-10-CM

## 2011-12-24 DIAGNOSIS — R209 Unspecified disturbances of skin sensation: Secondary | ICD-10-CM | POA: Insufficient documentation

## 2011-12-24 DIAGNOSIS — Z79899 Other long term (current) drug therapy: Secondary | ICD-10-CM | POA: Insufficient documentation

## 2011-12-24 DIAGNOSIS — IMO0002 Reserved for concepts with insufficient information to code with codable children: Secondary | ICD-10-CM | POA: Insufficient documentation

## 2011-12-24 DIAGNOSIS — K219 Gastro-esophageal reflux disease without esophagitis: Secondary | ICD-10-CM | POA: Insufficient documentation

## 2011-12-24 DIAGNOSIS — D72829 Elevated white blood cell count, unspecified: Secondary | ICD-10-CM | POA: Insufficient documentation

## 2011-12-24 DIAGNOSIS — R51 Headache: Secondary | ICD-10-CM | POA: Insufficient documentation

## 2011-12-24 DIAGNOSIS — E0590 Thyrotoxicosis, unspecified without thyrotoxic crisis or storm: Secondary | ICD-10-CM | POA: Insufficient documentation

## 2011-12-24 LAB — BASIC METABOLIC PANEL
CO2: 25 mEq/L (ref 19–32)
Calcium: 8.9 mg/dL (ref 8.4–10.5)
Chloride: 102 mEq/L (ref 96–112)
Creatinine, Ser: 0.66 mg/dL (ref 0.50–1.10)
GFR calc Af Amer: >90 mL/min (ref >90)
GFR calc non Af Amer: >90 mL/min (ref >90)
Glucose, Bld: 110 mg/dL — ABNORMAL HIGH (ref 70–99)
Potassium: 3.1 mEq/L — ABNORMAL LOW (ref 3.5–5.1)

## 2011-12-24 LAB — POCT I-STAT TROPONIN I: Troponin i, poc: 0 ng/mL (ref 0.00–0.08)

## 2011-12-24 LAB — CBC
HCT: 49 % — ABNORMAL HIGH (ref 36.0–46.0)
Hemoglobin: 17.1 g/dL — ABNORMAL HIGH (ref 12.0–15.0)
WBC: 12.4 10*3/uL — ABNORMAL HIGH (ref 4.0–10.5)

## 2011-12-24 MED ORDER — METOCLOPRAMIDE HCL 5 MG/ML IJ SOLN
10.0000 mg | Freq: Once | INTRAMUSCULAR | Status: AC
  Administered 2011-12-24: 10 mg via INTRAVENOUS
  Filled 2011-12-24: qty 2

## 2011-12-24 MED ORDER — DIPHENHYDRAMINE HCL 50 MG/ML IJ SOLN
25.0000 mg | Freq: Once | INTRAMUSCULAR | Status: AC
  Administered 2011-12-24: 25 mg via INTRAVENOUS
  Filled 2011-12-24: qty 1

## 2011-12-24 MED ORDER — PREDNISONE 20 MG PO TABS
30.0000 mg | ORAL_TABLET | Freq: Once | ORAL | Status: AC
  Administered 2011-12-24: 30 mg via ORAL
  Filled 2011-12-24: qty 2

## 2011-12-24 MED ORDER — KETOROLAC TROMETHAMINE 30 MG/ML IJ SOLN
30.0000 mg | Freq: Once | INTRAMUSCULAR | Status: AC
  Administered 2011-12-24: 30 mg via INTRAVENOUS
  Filled 2011-12-24: qty 1

## 2011-12-24 MED ORDER — POTASSIUM CHLORIDE CRYS ER 20 MEQ PO TBCR
40.0000 meq | EXTENDED_RELEASE_TABLET | Freq: Once | ORAL | Status: AC
  Administered 2011-12-24: 40 meq via ORAL
  Filled 2011-12-24: qty 2

## 2011-12-24 NOTE — ED Notes (Signed)
Pt reports headache on and off since Friday - was treated at [place] on Friday night for same complaint. States that Tramadol makes her feel funny, light makes pain worse.

## 2011-12-24 NOTE — ED Notes (Addendum)
Started to have headache Friday, toradol and reglan given; went home and prescribed tramadol; Monday went back to work and started to have headache again; went to PCP on Monday and was prescribed prednisone, which helped some; yesterday headache went away, but back today; reports lightheadedness, dizziness, feels like heart racing, nausea; hx of migraines but usually takes excedrin and relieves migraine

## 2011-12-24 NOTE — ED Provider Notes (Signed)
Chief Complaint  Patient presents with  . Migraine    History of Present Illness:   Is a 46 year old female who has had a one-week history of a severe migraine headache. The headache involves the entire head and is throbbing in nature. It is rated 7-8/10 in intensity. She has had nausea but no vomiting, photophobia, and phonophobia. The pain is worse with activity and better at rest. She feels lightheaded and notes numbness and tingling of her head and of both of her hands but no other neurological symptoms. Specifically she denies any diplopia, blurred vision, visual auras, muscle weakness, difficulty with speech, or ambulation. She has a many year history of migraine headaches, but this headache is the worst of any. She has taken Excedrin Migraine in the past and has seen a headache specialist. For this headache she went to the emergency room last Friday when this first began and was given Toradol, Reglan, IV fluids, and tramadol to take at home. She did not get any better. She followed up with her primary care physician this past Monday and was given a prednisone taper and Phenergan. She still feels no better.  Review of Systems:  Other than noted above, the patient denies any of the following symptoms: Systemic:  No fever, chills, fatigue, photophobia, stiff neck. Eye:  No redness, eye pain, discharge, blurred vision, or diplopia. ENT:  No nasal congestion, rhinorrhea, sinus pressure or pain, sneezing, earache, or sore throat.  No jaw claudication. Neuro:  No paresthesias, loss of consciousness, seizure activity, muscle weakness, trouble with coordination or gait, trouble speaking or swallowing. Psych:  No depression, anxiety or trouble sleeping.  PMFSH:  Past medical history, family history, social history, meds, and allergies were reviewed.  Physical Exam:   Vital signs:  BP 148/84  Pulse 103  Temp 97.9 F (36.6 C) (Oral)  Resp 16  SpO2 98%  LMP 09/30/2010 General:  Alert and oriented.   In no distress. Eye:  Lids and conjunctivas normal.  PERRL,  Full EOMs.  Fundi benign with normal discs and vessels. ENT:  No cranial or facial tenderness to palpation.  TMs and canals clear.  Nasal mucosa was normal and uncongested without any drainage. No intra oral lesions, pharynx clear, mucous membranes moist, dentition normal. Neck:  Supple, full ROM, no tenderness to palpation.  No adenopathy or mass. Neuro:  Alert and orented times 3.  Speech was clear, fluent, and appropriate.  Cranial nerves intact. No pronator drift, muscle strength normal. Finger to nose normal.  DTRs were 2+ and symmetrical.Station and gait were normal.  Romberg's sign was normal.  Able to perform tandem gait well. Psych:  Normal affect.  Assessment:  The encounter diagnosis was Migraine headache.  Plan:   1.  The following meds were prescribed:   New Prescriptions   No medications on file   2.  The patient was interested in having further workup for this headache since she has been in twice before. I told her that we did not do CT scans or any other advanced imaging here at the [facility]. I gave her the option of being transferred to the emergency department. She opted to be transported to the emergency department and we sent her by shuttle.   Reuben Likes, MD 12/24/11 [phone]

## 2011-12-24 NOTE — ED Provider Notes (Signed)
History     CSN: [id number]  Arrival date & time 12/24/11  1537   First MD Initiated Contact with Patient 12/24/11 1902      Chief Complaint  Patient presents with  . Dizziness  . Headache   HPI  History provided by the patient and recent medical charts. Patient is a 46 year old female with past history is of hyperthyroidism and no migraine headaches who presents with complaints of recurrent headaches. Patient states she has been having off-and-on headaches for the past week or more. Patient was initially seen and treated in the emergency department one week ago Friday. She reports having good relief of symptoms after medications in the emergency room this lasted for a few days. Symptoms returned over the weekend and on Monday she followed up with PCP. She was given prescriptions for prednisone which she has been taking a taper dose for this week. Headache has been off and on with occasional improvements patient states headache has sometimes been left-sided or right-sided. Symptoms are also associated with a numbness and tingling sensation of head and face and occasionally upper arms. She also reports photophobia and slight nausea with severe pains. She has been eating and drinking normally. She denies any fever, chills. Symptoms seemed worse again today and patient was evaluated at urgent care Center. She was given option for additional evaluation including possible CT scan in the emergency room and was transferred here. No other treatment was provided. Patient still continues to complain of a frontal headache with slight numbness and feeling sensation of the head and face. Denies any weakness or paralysis. Denies any speech difficulties. Denies any confusion.    Past Medical History  Diagnosis Date  . Hyperthyroidism     thyrodectomy 2006  . Blood transfusion     2 units transfuesd 2000  . Headache     migraines - hormonal - last one 6/12 -OTC  . Acid reflux     Past Surgical History    Procedure Date  . Breast surgery     lumpectomy Left 2009  . Myomectomy     2010  . Dilatation and curettage     2009  . Total abdominal hysterectomy     10/27/10  . Abdominal hysterectomy 10/27/2010    Procedure: HYSTERECTOMY ABDOMINAL;  Surgeon: Zelphia Cairo;  Location: [facility];  Service: Gynecology;  Laterality: N/A;  . Thyroidectomy     History reviewed. No pertinent family history.  History  Substance Use Topics  . Smoking status: Never Smoker   . Smokeless tobacco: Not on file  . Alcohol Use: No    OB History    Grav Para Term Preterm Abortions TAB SAB Ect Mult Living                  Review of Systems  Constitutional: Negative for fever, chills and unexpected weight change.  HENT: Negative for neck pain.   Respiratory: Negative for cough.   Cardiovascular: Negative for chest pain.  Gastrointestinal: Negative for nausea, vomiting and abdominal pain.  Genitourinary: Negative for dysuria, frequency, hematuria and flank pain.  Skin: Negative for rash.  Neurological: Positive for numbness and headaches. Negative for facial asymmetry and speech difficulty.    Allergies  Review of patient's allergies indicates no known allergies.  Home Medications   Current Outpatient Rx  Name Route Sig Dispense Refill  . ASPIRIN-ACETAMINOPHEN-CAFFEINE 250-250-65 MG PO TABS Oral Take 2 tablets by mouth every 6 (six) hours as needed. Headache    .  CALCIUM CARBONATE-VIT D-MIN PO Oral Take 1 tablet by mouth daily.      Marland Kitchen ESOMEPRAZOLE MAGNESIUM 20 MG PO CPDR Oral Take 20 mg by mouth daily before breakfast.    . IBUPROFEN 200 MG PO TABS Oral Take 400 mg by mouth every 6 (six) hours as needed. Headache    . LEVOTHYROXINE SODIUM 137 MCG PO TABS Oral Take 137 mcg by mouth 2 (two) times a week. Patient takes 1 tablet daily on Saturday and Sunday only, 150 mcg mon-fri    . LEVOTHYROXINE SODIUM 150 MCG PO TABS Oral Take 150 mcg by mouth daily. Patient takes 1 tablet daily on Monday through  Friday only      . ONE-DAILY MULTI VITAMINS PO TABS Oral Take 1 tablet by mouth daily.      Marland Kitchen PREDNISONE 10 MG PO TABS Oral Take 40 mg by mouth daily. Tapering dose started with 60 mg on Monday (11-20-11) and has dropped 10 mg per day since    . TRAMADOL HCL 50 MG PO TABS Oral Take 50 mg by mouth every 6 (six) hours as needed. For pain      BP 147/94  Pulse 74  Temp 99 F (37.2 C) (Oral)  Resp 17  SpO2 100%  LMP 09/30/2010  Physical Exam  Nursing note and vitals reviewed. Constitutional: She is oriented to person, place, and time. She appears well-developed and well-nourished. No distress.  HENT:  Head: Normocephalic and atraumatic.  Mouth/Throat: Oropharynx is clear and moist.  Eyes: Conjunctivae and EOM are normal. Pupils are equal, round, and reactive to light.  Neck: Normal range of motion. Neck supple.       No meningeal signs  Cardiovascular: Normal rate and regular rhythm.   No murmur heard. Pulmonary/Chest: Effort normal and breath sounds normal. No respiratory distress. She has no wheezes. She has no rales.  Abdominal: Soft. There is no tenderness. There is no rebound and no guarding.  Neurological: She is alert and oriented to person, place, and time. She has normal strength. No cranial nerve deficit or sensory deficit. Coordination normal.  Skin: Skin is warm and dry. No rash noted.  Psychiatric: She has a normal mood and affect. Her behavior is normal.    ED Course  Procedures   Results for orders placed during the hospital encounter of 12/24/11  CBC      Component Value Range   WBC 12.4 (*) 4.0 - 10.5 K/uL   RBC 5.49 (*) 3.87 - 5.11 MIL/uL   Hemoglobin 17.1 (*) 12.0 - 15.0 g/dL   HCT 16.1 (*) 09.6 - 04.5 %   MCV 89.3  78.0 - 100.0 fL   MCH 31.1  26.0 - 34.0 pg   MCHC 34.9  30.0 - 36.0 g/dL   RDW 40.9  81.1 - 91.4 %   Platelets 327  150 - 400 K/uL  BASIC METABOLIC PANEL      Component Value Range   Sodium 140  135 - 145 mEq/L   Potassium 3.1 (*) 3.5 - 5.1  mEq/L   Chloride 102  96 - 112 mEq/L   CO2 25  19 - 32 mEq/L   Glucose, Bld 110 (*) 70 - 99 mg/dL   BUN 9  6 - 23 mg/dL   Creatinine, Ser 7.82  0.50 - 1.10 mg/dL   Calcium 8.9  8.4 - 95.6 mg/dL   GFR calc non Af Amer >90  >90 mL/min   GFR calc Af Amer >90  >90 mL/min  POCT I-STAT TROPONIN I      Component Value Range   Troponin i, poc 0.00  0.00 - 0.08 ng/mL   Comment 3                Ct Head Wo Contrast  12/24/2011  *RADIOLOGY REPORT*  Clinical Data: Severe migraine headache with nausea for 1 week.  CT HEAD WITHOUT CONTRAST  Technique:  Contiguous axial images were obtained from the base of the skull through the vertex without contrast.  Comparison: None.  Findings: There is no evidence of acute intracranial hemorrhage, mass lesion, brain edema or extra-axial fluid collection.  The ventricles and subarachnoid spaces are appropriately sized for age. There is no CT evidence of acute cortical infarction.  The visualized paranasal sinuses are clear. The calvarium is intact.  IMPRESSION: Unremarkable noncontrast head CT.   Original Report Authenticated By: Gerrianne Scale, M.D.      1. Headache       MDM  6:50PM patient seen and evaluated. Patient presents from urgent care Center for continued evaluation of headache symptoms. A CT scan pending.   Toradol, Reglan, Benadryl ordered for headache symptoms.  Slightly elevated WBC most likely a result of current steroid use.  Patient reports feeling significantly better after medicines. CT unremarkable. Patient will followup with PCP.   Date: 12/24/2011  Rate: 84  Rhythm: normal sinus rhythm  QRS Axis: normal  Intervals: normal  ST/T Wave abnormalities: nonspecific ST/T changes  Conduction Disutrbances:none  Narrative Interpretation:   Old EKG Reviewed: unchanged from 12/18/2011     Angus Seller, PA 12/25/11 [phone]

## 2012-01-01 NOTE — ED Provider Notes (Signed)
Medical screening examination/treatment/procedure(s) were performed by non-physician practitioner and as supervising physician I was immediately available for consultation/collaboration.  Marwan T Powers, MD 01/01/12 1503 

## 2013-05-31 ENCOUNTER — Encounter: Payer: Self-pay | Admitting: Internal Medicine

## 2013-07-04 ENCOUNTER — Encounter: Payer: Self-pay | Admitting: Internal Medicine

## 2013-07-04 ENCOUNTER — Ambulatory Visit (INDEPENDENT_AMBULATORY_CARE_PROVIDER_SITE_OTHER): Payer: 59 | Admitting: Internal Medicine

## 2013-07-04 VITALS — BP 110/60 | HR 76 | Ht 63.0 in | Wt 126.0 lb

## 2013-07-04 DIAGNOSIS — R7689 Other specified abnormal immunological findings in serum: Secondary | ICD-10-CM | POA: Insufficient documentation

## 2013-07-04 DIAGNOSIS — K5900 Constipation, unspecified: Secondary | ICD-10-CM

## 2013-07-04 DIAGNOSIS — K5909 Other constipation: Secondary | ICD-10-CM

## 2013-07-04 DIAGNOSIS — K625 Hemorrhage of anus and rectum: Secondary | ICD-10-CM | POA: Insufficient documentation

## 2013-07-04 DIAGNOSIS — R894 Abnormal immunological findings in specimens from other organs, systems and tissues: Secondary | ICD-10-CM

## 2013-07-04 DIAGNOSIS — R768 Other specified abnormal immunological findings in serum: Secondary | ICD-10-CM

## 2013-07-04 NOTE — Assessment & Plan Note (Signed)
Treatment of this has improved appetite and overall well-being - await H. Pylori stool ag to see As long as continues to improve and labs are ok would not pursue endoscopic evaluation

## 2013-07-04 NOTE — Progress Notes (Signed)
Subjective:    Patient ID: Jasmine [name], female    DOB: 11-10-1965, 48 y.o.   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 She has been bothered by upper abdominal pain and some anorexia as well as worsening constipation in the past few months. She was seen by PCP in late Jan and had NL CBC and UA but + H. Pylori Ab - was treated with amoxicillin, clarithromycin and PPI. Abdominal pain got better as did appetite. She has lost some wight - 8#? But thinks starting to come back. She was having less urge to defecate and some straining. That is all better as is bloating after starting MirLax. On 1-2 x she has seen a tiny amount of bright red blood on stool surface but not since starting MiraLax. She says her TSH has been ok. Just submitted stool H. Pylori Ag to check for eradication (PCP - Dr. Orland Penman Sadie Haber)  No Known Allergies Outpatient Prescriptions Prior to Visit  Medication Sig Dispense Refill  . CALCIUM CARBONATE-VIT D-MIN PO Take 1 tablet by mouth daily.        Marland Kitchen levothyroxine (SYNTHROID, LEVOTHROID) 137 MCG tablet Take 137 mcg by mouth 2 (two) times a week. Patient takes 1 tablet daily on Saturday and Sunday only, 150 mcg mon-fri      . levothyroxine (SYNTHROID, LEVOTHROID) 150 MCG tablet Take 150 mcg by mouth daily. Patient takes 1 tablet daily on Monday through Friday only        . Multiple Vitamin (MULTIVITAMIN) tablet Take 1 tablet by mouth daily.        . ibuprofen (ADVIL,MOTRIN) 200 MG tablet Take 400 mg by mouth every 6 (six) hours as needed. Headache      . aspirin-acetaminophen-caffeine (EXCEDRIN MIGRAINE) 250-250-65 MG per tablet Take 2 tablets by mouth every 6 (six) hours as needed. Headache      . esomeprazole (NEXIUM) 20 MG capsule Take 20 mg by mouth daily before breakfast.      . predniSONE (DELTASONE) 10 MG tablet Take 40 mg by mouth daily.  Tapering dose started with 60 mg on Monday (11-20-11) and has dropped 10 mg per day since      . traMADol (ULTRAM) 50 MG tablet Take 50 mg by mouth every 6 (six) hours as needed. For pain       No facility-administered medications prior to visit.   Past Medical History  Diagnosis Date  . Hyperthyroidism     thyrodectomy 2006  . Blood transfusion     2 units transfuesd 2000  . Headache(784.0)     migraines - hormonal - last one 6/12 -OTC  . Acid reflux   . Helicobacter pylori ab+    Past Surgical History  Procedure Laterality Date  . Breast surgery      lumpectomy Left 2009  . Myomectomy      2010  . Dilatation and curettage      20 09  . Abdominal hysterectomy  10/27/2010    Procedure: HYSTERECTOMY ABDOMINAL;  Surgeon: Marylynn Pearson;  Location: [facility];  Service: Gynecology;  Laterality: N/A;  . Thyroidectomy     History   Social History  . Marital Status: Married    Spouse Name: N/A    Number of Children: 0  . Years of Education: RN   Social History Main Topics  .  Smoking status: Never Smoker   . Smokeless tobacco: Never Used  . Alcohol Use: No  . Drug Use: No  . Sexual Activity: Yes    Birth Control/ Protection: None     Family History  Problem Relation Age of Onset  . Colon cancer Neg Hx     Review of Systems All other ROS negative    Objective:   Physical Exam General:  Well-developed, well-nourished and in no acute distress, Filipino woman Eyes:  anicteric. Neck:   supple w/o thyromegaly or mass. S/p thyroidectomy Lungs: Clear to auscultation bilaterally. Heart:  S1S2, no rubs, murmurs, gallops. Abdomen:  soft, non-tender, no hepatosplenomegaly, hernia, or mass and BS+.  Rectal:   Female staff present   Anoderm inspection revealed small anal tags LL and RA Anal wink was + Digital exam revealed normal resting tone and voluntary squeeze. No mass or rectocele present. Simulated defecation with valsalva revealed appropriate abdominal contraction and  descent.   Anoscopy was performed with the patient in the left lateral decubitus position while a chaperone was present and revealed small internal external hemorrhoids mostly LL and RP*   Lymph:  no cervical or supraclavicular adenopathy. Extremities:   no edema Skin   no rash. Neuro:  A&O x 3.  Psych:  appropriate mood and  Affect.   Data Reviewed: As per HPI    Assessment & Plan:  Chronic constipation Has responded to MiraLax - to continue   Rectal bleeding Has some small int/ext hemorrhoids - I think these and constipation caused slight rectal bleeding x 2 Since she is  not anemic and constipation has been very chronic I do not think she needs endoscopic evaluation - we discussed colonoscopy but decided not to pursue If she fails to continue to improve then reconsider - otherwise routine at 51   Helicobacter pylori ab+ Treatment of this has improved appetite and overall well-being - await H. Pylori stool ag to see As long as continues to improve and labs are ok would not pursue endoscopic evaluation   DX:IPJAS, ADAKU, MD

## 2013-07-04 NOTE — Patient Instructions (Signed)
Please continue Miralax.  Dr. Carlean Purl is going to review your labs from [place].  If you don't continue to improve please call us back.  I appreciate the opportunity to care for you.

## 2013-07-04 NOTE — Assessment & Plan Note (Addendum)
Has some small int/ext hemorrhoids - I think these and constipation caused slight rectal bleeding x 2 Since she is  not anemic and constipation has been very chronic I do not think she needs endoscopic evaluation - we discussed colonoscopy but decided not to pursue If she fails to continue to improve then reconsider - otherwise routine at 51

## 2013-07-04 NOTE — Assessment & Plan Note (Signed)
Has responded to MiraLax - to continue

## 2014-08-15 ENCOUNTER — Other Ambulatory Visit: Payer: Self-pay | Admitting: Gynecology

## 2014-08-16 LAB — CYTOLOGY - PAP

## 2014-12-02 ENCOUNTER — Emergency Department (HOSPITAL_COMMUNITY): Payer: 59

## 2014-12-02 ENCOUNTER — Encounter (HOSPITAL_COMMUNITY): Payer: Self-pay | Admitting: Emergency Medicine

## 2014-12-02 ENCOUNTER — Observation Stay (HOSPITAL_COMMUNITY)
Admission: EM | Admit: 2014-12-02 | Discharge: 2014-12-03 | Disposition: A | Discharge status: Home / Self Care | Payer: 59 | Attending: Internal Medicine | Admitting: Internal Medicine

## 2014-12-02 DIAGNOSIS — Z79899 Other long term (current) drug therapy: Secondary | ICD-10-CM | POA: Insufficient documentation

## 2014-12-02 DIAGNOSIS — Z8619 Personal history of other infectious and parasitic diseases: Secondary | ICD-10-CM | POA: Insufficient documentation

## 2014-12-02 DIAGNOSIS — R000 Tachycardia, unspecified: Secondary | ICD-10-CM | POA: Diagnosis not present

## 2014-12-02 DIAGNOSIS — K219 Gastro-esophageal reflux disease without esophagitis: Secondary | ICD-10-CM | POA: Diagnosis not present

## 2014-12-02 DIAGNOSIS — R079 Chest pain, unspecified: Secondary | ICD-10-CM | POA: Diagnosis present

## 2014-12-02 DIAGNOSIS — R0789 Other chest pain: Principal | ICD-10-CM | POA: Insufficient documentation

## 2014-12-02 DIAGNOSIS — E039 Hypothyroidism, unspecified: Secondary | ICD-10-CM | POA: Diagnosis present

## 2014-12-02 DIAGNOSIS — R072 Precordial pain: Secondary | ICD-10-CM | POA: Diagnosis present

## 2014-12-02 DIAGNOSIS — E0590 Thyrotoxicosis, unspecified without thyrotoxic crisis or storm: Secondary | ICD-10-CM | POA: Insufficient documentation

## 2014-12-02 LAB — CBC
HCT: 46.9 % — ABNORMAL HIGH (ref 36.0–46.0)
HEMOGLOBIN: 16.3 g/dL — AB (ref 12.0–15.0)
MCH: 30.6 pg (ref 26.0–34.0)
MCHC: 34.8 g/dL (ref 30.0–36.0)
MCV: 88.2 fL (ref 78.0–100.0)
PLATELETS: 315 10*3/uL (ref 150–400)
RBC: 5.32 MIL/uL — ABNORMAL HIGH (ref 3.87–5.11)
RDW: 12.5 % (ref 11.5–15.5)
WBC: 8.6 10*3/uL (ref 4.0–10.5)

## 2014-12-02 LAB — BASIC METABOLIC PANEL
ANION GAP: 10 (ref 5–15)
CALCIUM: 8.8 mg/dL — AB (ref 8.9–10.3)
CO2: 21 mmol/L — AB (ref 22–32)
CREATININE: 0.76 mg/dL (ref 0.44–1.00)
Chloride: 106 mmol/L (ref 101–111)
GFR calc non Af Amer: >60 mL/min (ref >60)
GLUCOSE: 147 mg/dL — AB (ref 65–99)
POTASSIUM: 3.4 mmol/L — AB (ref 3.5–5.1)
SODIUM: 137 mmol/L (ref 135–145)

## 2014-12-02 LAB — I-STAT TROPONIN, ED: Troponin i, poc: 0 ng/mL (ref 0.00–0.08)

## 2014-12-02 NOTE — ED Notes (Signed)
Pt. reports central chest pain radiating to mid back and left arm with mild SOB and diaphoresis , no nausea or vomitting / denies cough or congestion .

## 2014-12-03 ENCOUNTER — Ambulatory Visit (HOSPITAL_COMMUNITY): Payer: 59

## 2014-12-03 ENCOUNTER — Encounter (HOSPITAL_COMMUNITY): Payer: Self-pay | Admitting: Internal Medicine

## 2014-12-03 DIAGNOSIS — E039 Hypothyroidism, unspecified: Secondary | ICD-10-CM | POA: Diagnosis present

## 2014-12-03 DIAGNOSIS — R0789 Other chest pain: Secondary | ICD-10-CM | POA: Diagnosis not present

## 2014-12-03 DIAGNOSIS — R072 Precordial pain: Secondary | ICD-10-CM

## 2014-12-03 DIAGNOSIS — R079 Chest pain, unspecified: Secondary | ICD-10-CM

## 2014-12-03 LAB — CREATININE, SERUM: Creatinine, Ser: 0.71 mg/dL (ref 0.44–1.00)

## 2014-12-03 LAB — COMPREHENSIVE METABOLIC PANEL
ALT: 10 U/L — AB (ref 14–54)
AST: 16 U/L (ref 15–41)
Albumin: 3.9 g/dL (ref 3.5–5.0)
Alkaline Phosphatase: 33 U/L — ABNORMAL LOW (ref 38–126)
Anion gap: 7 (ref 5–15)
BUN: 5 mg/dL — ABNORMAL LOW (ref 6–20)
CHLORIDE: 107 mmol/L (ref 101–111)
CO2: 26 mmol/L (ref 22–32)
CREATININE: 0.74 mg/dL (ref 0.44–1.00)
Calcium: 8.5 mg/dL — ABNORMAL LOW (ref 8.9–10.3)
Glucose, Bld: 125 mg/dL — ABNORMAL HIGH (ref 65–99)
POTASSIUM: 3.7 mmol/L (ref 3.5–5.1)
Sodium: 140 mmol/L (ref 135–145)
Total Bilirubin: 0.7 mg/dL (ref 0.3–1.2)
Total Protein: 7.1 g/dL (ref 6.5–8.1)

## 2014-12-03 LAB — CBC WITH DIFFERENTIAL/PLATELET
BASOS ABS: 0 10*3/uL (ref 0.0–0.1)
Basophils Relative: 0 % (ref 0–1)
EOS ABS: 0.1 10*3/uL (ref 0.0–0.7)
Eosinophils Relative: 1 % (ref 0–5)
HCT: 45.9 % (ref 36.0–46.0)
HEMOGLOBIN: 16 g/dL — AB (ref 12.0–15.0)
LYMPHS ABS: 1.9 10*3/uL (ref 0.7–4.0)
Lymphocytes Relative: 21 % (ref 12–46)
MCH: 30.7 pg (ref 26.0–34.0)
MCHC: 34.9 g/dL (ref 30.0–36.0)
MCV: 88.1 fL (ref 78.0–100.0)
Monocytes Absolute: 0.4 10*3/uL (ref 0.1–1.0)
Monocytes Relative: 4 % (ref 3–12)
NEUTROS PCT: 74 % (ref 43–77)
Neutro Abs: 6.6 10*3/uL (ref 1.7–7.7)
Platelets: 299 10*3/uL (ref 150–400)
RBC: 5.21 MIL/uL — AB (ref 3.87–5.11)
RDW: 12.5 % (ref 11.5–15.5)
WBC: 8.9 10*3/uL (ref 4.0–10.5)

## 2014-12-03 LAB — D-DIMER, QUANTITATIVE (NOT AT ARMC)

## 2014-12-03 LAB — RAPID URINE DRUG SCREEN, HOSP PERFORMED
AMPHETAMINES: NOT DETECTED
BENZODIAZEPINES: NOT DETECTED
Barbiturates: NOT DETECTED
COCAINE: NOT DETECTED
OPIATES: NOT DETECTED
Tetrahydrocannabinol: NOT DETECTED

## 2014-12-03 LAB — PREGNANCY, URINE: PREG TEST UR: NEGATIVE

## 2014-12-03 LAB — CBC
HEMATOCRIT: 42.5 % (ref 36.0–46.0)
HEMOGLOBIN: 14.7 g/dL (ref 12.0–15.0)
MCH: 30.2 pg (ref 26.0–34.0)
MCHC: 34.6 g/dL (ref 30.0–36.0)
MCV: 87.4 fL (ref 78.0–100.0)
PLATELETS: 287 10*3/uL (ref 150–400)
RBC: 4.86 MIL/uL (ref 3.87–5.11)
RDW: 12.5 % (ref 11.5–15.5)
WBC: 11.1 10*3/uL — ABNORMAL HIGH (ref 4.0–10.5)

## 2014-12-03 LAB — TROPONIN I
Troponin I: <0.03 ng/mL (ref <0.031)
Troponin I: <0.03 ng/mL (ref <0.031)

## 2014-12-03 LAB — TSH: TSH: 1.169 u[IU]/mL (ref 0.350–4.500)

## 2014-12-03 MED ORDER — KETOROLAC TROMETHAMINE 30 MG/ML IJ SOLN
15.0000 mg | Freq: Once | INTRAMUSCULAR | Status: AC
  Administered 2014-12-03: 15 mg via INTRAVENOUS
  Filled 2014-12-03: qty 1

## 2014-12-03 MED ORDER — METHYLPREDNISOLONE 4 MG PO TBPK: 8.0000 mg | ORAL_TABLET | Freq: Every evening | ORAL | Status: DC

## 2014-12-03 MED ORDER — PANTOPRAZOLE SODIUM 40 MG PO PACK
40.0000 mg | PACK | Freq: Every day | ORAL | Status: DC
  Administered 2014-12-03: 40 mg
  Filled 2014-12-03: qty 20

## 2014-12-03 MED ORDER — METHYLPREDNISOLONE 4 MG PO TBPK: 4.0000 mg | ORAL_TABLET | ORAL | Status: DC

## 2014-12-03 MED ORDER — METHYLPREDNISOLONE 4 MG PO TBPK
8.0000 mg | ORAL_TABLET | Freq: Every morning | ORAL | Status: AC
  Administered 2014-12-03: 8 mg via ORAL
  Filled 2014-12-03: qty 21

## 2014-12-03 MED ORDER — TRAMADOL HCL 50 MG PO TABS
50.0000 mg | ORAL_TABLET | Freq: Four times a day (QID) | ORAL | Status: DC | PRN
  Administered 2014-12-03: 50 mg via ORAL
  Filled 2014-12-03: qty 1

## 2014-12-03 MED ORDER — TOPIRAMATE 25 MG PO TABS
25.0000 mg | ORAL_TABLET | Freq: Every day | ORAL | Status: DC
  Filled 2014-12-03: qty 1

## 2014-12-03 MED ORDER — METHYLPREDNISOLONE 4 MG PO TBPK: 4.0000 mg | ORAL_TABLET | Freq: Three times a day (TID) | ORAL | Status: DC

## 2014-12-03 MED ORDER — TOPIRAMATE 25 MG PO CPSP: 25.0000 mg | ORAL_CAPSULE | Freq: Every day | ORAL | Status: DC

## 2014-12-03 MED ORDER — TRAMADOL HCL 50 MG PO TABS: 50.0000 mg | ORAL_TABLET | Freq: Four times a day (QID) | ORAL | Status: DC | PRN

## 2014-12-03 MED ORDER — ENOXAPARIN SODIUM 40 MG/0.4ML ~~LOC~~ SOLN
40.0000 mg | SUBCUTANEOUS | Status: DC
  Administered 2014-12-03: 40 mg via SUBCUTANEOUS
  Filled 2014-12-03: qty 0.4

## 2014-12-03 MED ORDER — ATENOLOL 25 MG PO TABS: 50.0000 mg | ORAL_TABLET | Freq: Every day | ORAL | Status: DC

## 2014-12-03 MED ORDER — METOCLOPRAMIDE HCL 5 MG/ML IJ SOLN
10.0000 mg | Freq: Once | INTRAMUSCULAR | Status: AC
  Administered 2014-12-03: 10 mg via INTRAVENOUS
  Filled 2014-12-03: qty 2

## 2014-12-03 MED ORDER — PANTOPRAZOLE SODIUM 40 MG PO TBEC: 40.0000 mg | DELAYED_RELEASE_TABLET | Freq: Every day | ORAL | Status: DC

## 2014-12-03 MED ORDER — AMITRIPTYLINE HCL 10 MG PO TABS
20.0000 mg | ORAL_TABLET | Freq: Every day | ORAL | Status: DC
  Filled 2014-12-03: qty 2

## 2014-12-03 MED ORDER — METHYLPREDNISOLONE 4 MG PO TBPK
4.0000 mg | ORAL_TABLET | ORAL | Status: AC
  Administered 2014-12-03: 4 mg via ORAL

## 2014-12-03 MED ORDER — SODIUM CHLORIDE 0.9 % IJ SOLN
3.0000 mL | Freq: Two times a day (BID) | INTRAMUSCULAR | Status: DC
  Administered 2014-12-03 (×2): 3 mL via INTRAVENOUS

## 2014-12-03 MED ORDER — LEVOTHYROXINE SODIUM 25 MCG PO TABS
125.0000 ug | ORAL_TABLET | Freq: Every day | ORAL | Status: DC
  Administered 2014-12-03: 125 ug via ORAL
  Filled 2014-12-03 (×2): qty 1

## 2014-12-03 MED ORDER — METHYLPREDNISOLONE 4 MG PO TBPK: 4.0000 mg | ORAL_TABLET | Freq: Four times a day (QID) | ORAL | Status: DC

## 2014-12-03 MED ORDER — OMEPRAZOLE-SODIUM BICARBONATE 20-1100 MG PO CAPS
1.0000 | ORAL_CAPSULE | Freq: Every day | ORAL | Status: DC
  Filled 2014-12-03: qty 1

## 2014-12-03 MED ORDER — ONDANSETRON HCL 4 MG PO TABS: 4.0000 mg | ORAL_TABLET | Freq: Four times a day (QID) | ORAL | Status: DC | PRN

## 2014-12-03 MED ORDER — ONDANSETRON HCL 4 MG/2ML IJ SOLN: 4.0000 mg | Freq: Four times a day (QID) | INTRAMUSCULAR | Status: DC | PRN

## 2014-12-03 MED ORDER — ACETAMINOPHEN 650 MG RE SUPP: 650.0000 mg | Freq: Four times a day (QID) | RECTAL | Status: DC | PRN

## 2014-12-03 MED ORDER — METHYLPREDNISOLONE 4 MG PO TBPK: ORAL_TABLET | ORAL | Status: DC

## 2014-12-03 MED ORDER — ASPIRIN 81 MG PO CHEW
324.0000 mg | CHEWABLE_TABLET | Freq: Once | ORAL | Status: AC
  Administered 2014-12-03: 324 mg via ORAL
  Filled 2014-12-03: qty 4

## 2014-12-03 MED ORDER — ACETAMINOPHEN 325 MG PO TABS: 650.0000 mg | ORAL_TABLET | Freq: Four times a day (QID) | ORAL | Status: DC | PRN

## 2014-12-03 NOTE — Progress Notes (Signed)
Echocardiogram 2D Echocardiogram has been performed.  Tresa Res 12/03/2014, 12:29 PM

## 2014-12-03 NOTE — H&P (Signed)
[facility] History and Physical  Jasmine [name] TDS:[id number] DOB: 1965-07-17 DOA: 12/02/2014  Referring physician: Dr.Nanavati. PCP: Gavin Pound, MD  Specialists: None.  Chief Complaint: Chest pain.  HPI: Jasmine [name] is a 49 y.o. female with history of migraine, postoperative hypothyroidism presents to the ER because of chest pain. Patient started experiencing chest pain since last afternoon. Pain is left-sided anterior chest wall aching in sensation last for a few minutes each time has no relation to exertion or any associated diaphoresis shortness of breath or nausea. In the ER chest x-ray was unremarkable and EKG was showing nonspecific ST-T changes. Patient's chest pain is reproducible on palpation. Patient has family history of patient's uncle and cousins having myocardial infarction at age 52. Patient has been admitted for further observation. Presently chest pain-free.   Review of Systems: As presented in the history of presenting illness, rest negative.  Past Medical History  Diagnosis Date  . Hyperthyroidism     thyrodectomy 2006  . Blood transfusion     2 units transfuesd 2000  . Headache(784.0)     migraines - hormonal - last one 6/12 -OTC  . Acid reflux   . Helicobacter pylori ab+    Past Surgical History  Procedure Laterality Date  . Breast surgery      lumpectomy Left 2009  . Myomectomy      2010  . Dilatation and curettage      2009  . Abdominal hysterectomy  10/27/2010    Procedure: HYSTERECTOMY ABDOMINAL;  Surgeon: Marylynn Pearson;  Location: [facility];  Service: Gynecology;  Laterality: N/A;  . Thyroidectomy     Social History:  reports that she has never smoked. She has never used smokeless tobacco. She reports that she does not drink alcohol or use illicit drugs. Where does patient live home. Can patient participate in ADLs? Yes.  No Known Allergies  Family History:  Family History  Problem Relation Age of Onset  . Colon cancer Neg Hx    . CAD Paternal Uncle   . CAD Cousin       Prior to Admission medications   Medication Sig Start Date End Date Taking? Authorizing Provider  amitriptyline (ELAVIL) 10 MG tablet Take 20 mg by mouth at bedtime.   Yes Historical Provider, MD  atenolol (TENORMIN) 50 MG tablet Take 50 mg by mouth at bedtime.   Yes Historical Provider, MD  CHLORZOXAZONE PO Take 1 tablet by mouth daily as needed (migraines).   Yes Historical Provider, MD  ibuprofen (ADVIL,MOTRIN) 600 MG tablet Take 600 mg by mouth daily as needed (pain).   Yes Historical Provider, MD  levothyroxine (SYNTHROID, LEVOTHROID) 125 MCG tablet Take 125 mcg by mouth daily before breakfast.   Yes Historical Provider, MD  Multiple Vitamin (MULTIVITAMIN WITH MINERALS) TABS tablet Take 1 tablet by mouth daily with lunch.   Yes Historical Provider, MD  naratriptan (AMERGE) 2.5 MG tablet Take 2.5 mg by mouth See admin instructions. Take one (1) tablet at onset of headache; if returns or does not resolve, may repeat after 4 hours; do not exceed five (5) mg in 24 hours.   Yes Historical Provider, MD  Omeprazole-Sodium Bicarbonate (ZEGERID PO) Take 1 tablet by mouth daily as needed (acid reflux).   Yes Historical Provider, MD  Psyllium (METAMUCIL PO) Take 5 mLs by mouth daily.   Yes Historical Provider, MD  topiramate (TOPAMAX) 25 MG capsule Take 25 mg by mouth at bedtime.    Yes Historical Provider, MD  Physical Exam: Filed Vitals:   12/03/14 0045 12/03/14 0100 12/03/14 0115 12/03/14 0201  BP: 112/69 116/71 121/74 119/67  Pulse: 78 74 76 68  Temp:    98.2 F (36.8 C)  TempSrc:      Resp: 16 17 15 15   Height:    5\' 4"  (1.626 m)  Weight:    57.3 kg (126 lb 5.2 oz)  SpO2: 100% 100% 99% 100%     General:  Moderately built and nourished.  Eyes: Anicteric no pallor.  ENT: No discharge from the ears eyes nose or mouth.  Neck: No mass felt and no JVD appreciated.  Cardiovascular: S1 and S2 heard.  Respiratory: No rhonchi or  crepitations.  Abdomen: Soft nontender bowel sounds present.  Skin: No rash.  Musculoskeletal: No edema.  Psychiatric: Appears normal.  Neurologic: Alert awake oriented to time place and person. Moves all extremities.  Labs on Admission:  Basic Metabolic Panel:  Recent Labs Lab 12/02/14 1947  NA 137  K 3.4*  CL 106  CO2 21*  GLUCOSE 147*  BUN <5*  CREATININE 0.76  CALCIUM 8.8*   Liver Function Tests: No results for input(s): AST, ALT, ALKPHOS, BILITOT, PROT, ALBUMIN in the last 168 hours. No results for input(s): LIPASE, AMYLASE in the last 168 hours. No results for input(s): AMMONIA in the last 168 hours. CBC:  Recent Labs Lab 12/02/14 1947  WBC 8.6  HGB 16.3*  HCT 46.9*  MCV 88.2  PLT 315   Cardiac Enzymes: No results for input(s): CKTOTAL, CKMB, CKMBINDEX, TROPONINI in the last 168 hours.  BNP (last 3 results) No results for input(s): BNP in the last 8760 hours.  ProBNP (last 3 results) No results for input(s): PROBNP in the last 8760 hours.  CBG: No results for input(s): GLUCAP in the last 168 hours.  Radiological Exams on Admission: Dg Chest 2 View  12/02/2014   CLINICAL DATA:  Left-sided chest pain since last night.  EXAM: CHEST  2 VIEW  COMPARISON:  12/15/2011  FINDINGS: The heart size and mediastinal contours are within normal limits. Both lungs are clear. The visualized skeletal structures are unremarkable.  IMPRESSION: Normal exam.   Electronically Signed   By: Lorriane Shire M.D.   On: 12/02/2014 20:07    EKG: Independently reviewed. Normal sinus rhythm with nonspecific ST-T changes.  Assessment/Plan Principal Problem:   Chest pain Active Problems:   Chest pain, atypical   1. Chest pain - appears atypical and reproducible. Since patient has family history of MI at age 22 her cousin and paternal uncle we'll cycle cardiac markers and check 2-D echo. Check d-dimer. Aspirin. 2. 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 3. History of  migraine - continue present medication including beta blockers and Topamax.  I have personally reviewed patient's chest x-ray and EKG.   DVT Prophylaxis Lovenox.  Code Status: Full code.  Family Communication: Patient's husband at the bedside.  Disposition Plan: Admit for observation.    Andilynn Delavega N. [facility] Pager [phone].  If 7PM-7AM, please contact night-coverage [email or web address] Password [email or web address] 12/03/2014, 3:01 AM

## 2014-12-03 NOTE — Consult Note (Signed)
CARDIOLOGY CONSULT NOTE   Patient ID: Jasmine [name] MRN: [id number] DOB/AGE: Jan 05, 1966 49 y.o.  Admit Date: 12/02/2014  Primary Physician: Gavin Pound, MD  Primary Cardiologist    Rodney Langton   Clinical Summary Jasmine [name] is a 49 y.o.female. She was admitted to the chest pain observation unit early this morning. There is no prior history of significant cardiac disease. She's had some localized discomfort in the left anterior chest. She has felt the sensation and random. There is no exertional component or respiratory component. She's had no diaphoresis nausea or vomiting. She felt slight tingling in her left hand. Her discomfort can be reproduced with palpation of the anterior chest. Her screening troponin was normal. Other troponins are pending. There is a distant family history of coronary disease. EKG reveals mild diffuse ST flattening. This is unchanged from prior EKGs.  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   No Known Allergies  Medications Scheduled Medications: . amitriptyline  20 mg Oral QHS  . atenolol  50 mg Oral QHS  . enoxaparin (LOVENOX) injection  40 mg Subcutaneous Q24H  . levothyroxine  125 mcg Oral QAC breakfast  . pantoprazole sodium  40 mg Per Tube Daily  . sodium chloride  3 mL Intravenous Q12H  . topiramate  25 mg Oral QHS     Infusions:     PRN Medications:  acetaminophen **OR** acetaminophen, ondansetron **OR** ondansetron (ZOFRAN) IV   Past Medical History  Diagnosis Date  . Hyperthyroidism     thyrodectomy 2006  . Blood transfusion     2 units transfuesd 2000  . Headache(784.0)     migraines - hormonal - last one 6/12 -OTC  . Acid reflux   . Helicobacter pylori ab+     Past Surgical History  Procedure Laterality Date  . Breast surgery      lumpectomy Left 2009  . Myomectomy      2010  . Dilatation and curettage      2009  . Abdominal hysterectomy  10/27/2010    Procedure: HYSTERECTOMY ABDOMINAL;  Surgeon: Marylynn Pearson;  Location: [facility];  Service: Gynecology;  Laterality: N/A;  . Thyroidectomy      Family History  Problem Relation Age of Onset  . Colon cancer Neg Hx   . CAD Paternal Uncle   . CAD Cousin     Social History Jasmine [name] reports that she has never smoked. She has never used smokeless tobacco. Jasmine [name] reports that she does not drink alcohol.  Review of Systems   Physical Examination Blood pressure 119/67, pulse 68, temperature 98.2 F (36.8 C), temperature source Oral, resp. rate 15, height 5\' 4"  (1.626 m), weight 126 lb 5.2 oz (57.3 kg), last menstrual period 09/30/2010, SpO2 100 %.  Intake/Output Summary (Last 24 hours) at 12/03/14 0645 Last data filed at 12/03/14 0333  Gross per 24 hour  Intake      3 ml  Output      0 ml  Net      3 ml      Prior Cardiac Testing/Procedures  Lab Results  Basic Metabolic Panel:  Recent Labs Lab 12/02/14 1947  NA 137  K 3.4*  CL 106  CO2 21*  GLUCOSE 147*  BUN <5*  CREATININE 0.76  CALCIUM 8.8*    Liver Function Tests: No results for input(s): AST, ALT, ALKPHOS, BILITOT, PROT, ALBUMIN in the last 168 hours.  CBC:  Recent Labs Lab 12/02/14 1947  WBC 8.6  HGB 16.3*  HCT 46.9*  MCV 88.2  PLT 315    Cardiac Enzymes: No results for input(s): CKTOTAL, CKMB, CKMBINDEX, TROPONINI in the last 168 hours.  BNP: Invalid input(s): POCBNP   Radiology: Dg Chest 2 View  12/02/2014   CLINICAL DATA:  Left-sided chest pain since last night.  EXAM: CHEST  2 VIEW  COMPARISON:  12/15/2011  FINDINGS: The heart size and mediastinal contours are within normal limits. Both lungs are clear. The visualized skeletal structures are unremarkable.  IMPRESSION: Normal exam.   Electronically Signed   By: Lorriane Shire M.D.   On: 12/02/2014 20:07     ECG: I have reviewed current EKG and old EKGs. There are old nonspecific ST changes with mild diffuse ST flattening.  Telemetry:    Telemetry reveals normal sinus  rhythm.   Impression and Recommendations    Precordial chest pain     The patient's first troponin is negative. Other troponins are pending. A d-dimer is pending. At this point, there is no definite evidence of an acute coronary syndrome. I agree with completing her troponin evaluation. Two-dimensional echo has already been ordered. If her troponins are normal and her echo reveals normal left jugular function, she could be discharged home later today. To complete her evaluation, outpatient nuclear stress study can be done. Because she has resting ST abnormalities, a standard treadmill would not be helpful in her case. It is of note that the term myomectomy is used in her past medical history. This relates to fibroid removal procedures from her uterus in the past. She did not have a cardiac myomectomy.    Hypothyroidism     I did not see a recent TSH in this record. Therefore I ordered TSH to be done along with one of her blood draws for troponins.   Daryel November, MD 12/03/2014, 6:45 AM

## 2014-12-03 NOTE — ED Provider Notes (Signed)
CSN: [id number]     Arrival date & time 12/02/14  1926 History   This chart was scribed for Jasmine Biles, MD by Jasmine [name], ED Scribe. This patient was seen in room B19C/B19C and the patient's care was started 12:29 AM.   Chief Complaint  Patient presents with  . Chest Pain   The history is provided by the patient. No language interpreter was used.    HPI Comments: Jasmine [name] is a 49 y.o. female with a PMHx of hyperthyroidism who presents to the Emergency Department complaining of intermittent, ongoing central chest pain that radiates to the shoulder blade onset yesterday. Pain is described as pinching. Episodes last for a few minutes at a time. Discomfort is exacerbated with deep palpation. No alleviating factors at this time. Jasmine [name] also reports tingling to her L forearm that has now resolved. OTC Ibuprofen attempted prior to arrival without any improvement for symptoms. No recent fever, chills, nausea, vomiting, abdominal pain, or shortness of breath. No previous history of blood clots. No recent long distance travel. No recent surgeries. She is not a smoker. Uncle died at the age of 89 due to heart issues. In addition she states several of her cousins have passed at young ages, 52-30s from heart related problems. However, brother is 79 and sister is 74, both in good health without any cardiac issues. No known allergies to medications.  PCP: Jasmine Edison MD- [facility]  Past Medical History  Diagnosis Date  . Hyperthyroidism     thyrodectomy 2006  . Blood transfusion     2 units transfuesd 2000  . Headache(784.0)     migraines - hormonal - last one 6/12 -OTC  . Acid reflux   . Helicobacter pylori ab+    Past Surgical History  Procedure Laterality Date  . Breast surgery      lumpectomy Left 2009  . Myomectomy      2010  . Dilatation and curettage      2009  . Abdominal hysterectomy  10/27/2010    Procedure: HYSTERECTOMY ABDOMINAL;  Surgeon: Jasmine [name];  Location:  [facility];  Service: Gynecology;  Laterality: N/A;  . Thyroidectomy     Family History  Problem Relation Age of Onset  . Colon cancer Neg Hx   . CAD Paternal Uncle   . CAD Cousin    Social History  Substance Use Topics  . Smoking status: Never Smoker   . Smokeless tobacco: Never Used  . Alcohol Use: No   OB History    No data available     Review of Systems  Constitutional: Negative for fever and chills.  Respiratory: Negative for cough and shortness of breath.   Cardiovascular: Positive for chest pain.  Gastrointestinal: Negative for nausea, vomiting and abdominal pain.  Skin: Negative for rash.  Neurological: Negative for headaches.  Psychiatric/Behavioral: Negative for confusion.  All other systems reviewed and are negative.     Allergies  Review of patient's allergies indicates no known allergies.  Home Medications   Prior to Admission medications   Medication Sig Start Date End Date Taking? Authorizing Provider  amitriptyline (ELAVIL) 10 MG tablet Take 20 mg by mouth at bedtime.   Yes Historical Provider, MD  atenolol (TENORMIN) 50 MG tablet Take 50 mg by mouth at bedtime.   Yes Historical Provider, MD  CHLORZOXAZONE PO Take 1 tablet by mouth daily as needed (migraines).   Yes Historical Provider, MD  ibuprofen (ADVIL,MOTRIN) 600 MG tablet Take 600 mg by mouth  daily as needed (pain).   Yes Historical Provider, MD  levothyroxine (SYNTHROID, LEVOTHROID) 125 MCG tablet Take 125 mcg by mouth daily before breakfast.   Yes Historical Provider, MD  Multiple Vitamin (MULTIVITAMIN WITH MINERALS) TABS tablet Take 1 tablet by mouth daily with lunch.   Yes Historical Provider, MD  naratriptan (AMERGE) 2.5 MG tablet Take 2.5 mg by mouth See admin instructions. Take one (1) tablet at onset of headache; if returns or does not resolve, may repeat after 4 hours; do not exceed five (5) mg in 24 hours.   Yes Historical Provider, MD  Psyllium (METAMUCIL PO) Take 5 mLs by mouth daily.    Yes Historical Provider, MD  topiramate (TOPAMAX) 25 MG capsule Take 25 mg by mouth at bedtime.    Yes Historical Provider, MD  methylPREDNISolone (MEDROL DOSEPAK) 4 MG TBPK tablet Take as directed on the pack. 12/03/14   Jasmine Krystal Eaton, MD  pantoprazole (PROTONIX) 40 MG tablet Take 1 tablet (40 mg total) by mouth daily. 12/03/14   Jasmine Krystal Eaton, MD  traMADol (ULTRAM) 50 MG tablet Take 1 tablet (50 mg total) by mouth every 6 (six) hours as needed for moderate pain. 12/03/14   Jasmine Krystal Eaton, MD   Triage Vitals: BP 117/72 mmHg  Pulse 78  Temp(Src) 98.4 F (36.9 C) (Oral)  Resp 15  Ht 5\' 4"  (1.626 m)  Wt 126 lb 5.2 oz (57.3 kg)  BMI 21.67 kg/m2  SpO2 100%  LMP 09/30/2010   Physical Exam  Constitutional: She is oriented to person, place, and time. She appears well-developed and well-nourished. No distress.  HENT:  Head: Normocephalic and atraumatic.  Eyes: EOM are normal.  Neck: Normal range of motion.  Cardiovascular: Regular rhythm and normal heart sounds.  Tachycardia present.   Pulses:      Radial pulses are 2+ on the right side, and 2+ on the left side.       Femoral pulses are 2+ on the right side, and 2+ on the left side. Systolic murmur to L lateral sternal border  Pulmonary/Chest: Effort normal and breath sounds normal.  Lungs are clear to auscultations   Abdominal: Soft. She exhibits no distension. There is no tenderness.  Musculoskeletal: Normal range of motion. She exhibits no edema.  No unilateral swelling or pitting edema  No calf tenderness   Neurological: She is alert and oriented to person, place, and time.  Skin: Skin is warm and dry.  Psychiatric: She has a normal mood and affect. Judgment normal.  Nursing note and vitals reviewed.   ED Course  Procedures (including critical care time)  DIAGNOSTIC STUDIES: Oxygen Saturation is 100% on RA, Normal by my interpretation.    COORDINATION OF CARE: 12:37 AM- Will order BMP, CBC, CXR, EKG, i-stat troponin I.  Discussed treatment plan with pt at bedside and pt agreed to plan.     Labs Review Labs Reviewed  BASIC METABOLIC PANEL - Abnormal; Notable for the following:    Potassium 3.4 (*)    CO2 21 (*)    Glucose, Bld 147 (*)    BUN <5 (*)    Calcium 8.8 (*)    All other components within normal limits  CBC - Abnormal; Notable for the following:    RBC 5.32 (*)    Hemoglobin 16.3 (*)    HCT 46.9 (*)    All other components within normal limits  CBC - Abnormal; Notable for the following:    WBC 11.1 (*)  All other components within normal limits  COMPREHENSIVE METABOLIC PANEL - Abnormal; Notable for the following:    Glucose, Bld 125 (*)    BUN 5 (*)    Calcium 8.5 (*)    ALT 10 (*)    Alkaline Phosphatase 33 (*)    All other components within normal limits  CBC WITH DIFFERENTIAL/PLATELET - Abnormal; Notable for the following:    RBC 5.21 (*)    Hemoglobin 16.0 (*)    All other components within normal limits  TROPONIN I  TROPONIN I  CREATININE, SERUM  D-DIMER, QUANTITATIVE (NOT AT [facility])  URINE RAPID DRUG SCREEN, HOSP PERFORMED  PREGNANCY, URINE  TSH  I-STAT TROPOININ, ED    Imaging Review No results found. I personally reviewed and evaluated these images and lab results as part of my medical decision-making.   EKG Interpretation   Date/Time:  Sunday December 02 2014 19:30:29 EDT Ventricular Rate:  85 PR Interval:  134 QRS Duration: 72 QT Interval:  380 QTC Calculation: 452 R Axis:   41 Text Interpretation:  Normal sinus rhythm Nonspecific ST abnormality  Abnormal ECG No significant change vs 2013 Confirmed by Jeneen Rinks  MD, [place]  [phone]) on 12/02/2014 7:35:34 PM Also confirmed by Kathrynn Humble, MD, [facility]  [phone])  on 12/03/2014 12:57:17 AM      MDM   Final diagnoses:  Chest pain, atypical   I personally performed the services described in this documentation, which was scribed in my presence. The recorded information has been reviewed and is accurate.  Pt comes in with  chest pain. Pt has no significant cardiac risk factors - but she has family hx of premature CAD in her family. The chest pain has some typical features as well - and she has no cardiologist. WE will admit her for serial trops and hopefully Cards evaluation.  Jasmine Biles, MD 12/07/14 1334

## 2014-12-03 NOTE — Discharge Summary (Signed)
Physician Discharge Summary   Patient ID: Jasmine [name] MRN: [id number] DOB/AGE: February 14, 1966 49 y.o.  Admit date: 12/02/2014 Discharge date: 12/03/2014  Primary Care Physician:  Gavin Pound, MD  Discharge Diagnoses:    . Precordial chest pain possible costochondritis  . Hypothyroidism Abnormal EKG  Consults: Cardiology   Recommendations for Outpatient Follow-up:  Cardiology recommended possible outpatient stress test, office will arrange   DIET: Heart healthy diet    Allergies:  No Known Allergies   Discharge Medications:   Medication List    STOP taking these medications        ZEGERID PO      TAKE these medications        amitriptyline 10 MG tablet  Commonly known as:  ELAVIL  Take 20 mg by mouth at bedtime.     atenolol 50 MG tablet  Commonly known as:  TENORMIN  Take 50 mg by mouth at bedtime.     CHLORZOXAZONE PO  Take 1 tablet by mouth daily as needed (migraines).     ibuprofen 600 MG tablet  Commonly known as:  ADVIL,MOTRIN  Take 600 mg by mouth daily as needed (pain).     levothyroxine 125 MCG tablet  Commonly known as:  SYNTHROID, LEVOTHROID  Take 125 mcg by mouth daily before breakfast.     METAMUCIL PO  Take 5 mLs by mouth daily.     methylPREDNISolone 4 MG Tbpk tablet  Commonly known as:  MEDROL DOSEPAK  Take as directed on the pack.     multivitamin with minerals Tabs tablet  Take 1 tablet by mouth daily with lunch.     naratriptan 2.5 MG tablet  Commonly known as:  AMERGE  Take 2.5 mg by mouth See admin instructions. Take one (1) tablet at onset of headache; if returns or does not resolve, may repeat after 4 hours; do not exceed five (5) mg in 24 hours.     pantoprazole 40 MG tablet  Commonly known as:  PROTONIX  Take 1 tablet (40 mg total) by mouth daily.     topiramate 25 MG capsule  Commonly known as:  TOPAMAX  Take 25 mg by mouth at bedtime.     traMADol 50 MG tablet  Commonly known as:  ULTRAM  Take 1 tablet (50  mg total) by mouth every 6 (six) hours as needed for moderate pain.         Brief H and P: For complete details please refer to admission H and P, but in brief Jasmine [name] is a 49 y.o. female with history of migraine, postoperative hypothyroidism presents to the ER because of chest pain. Patient started experiencing chest pain since last afternoon. Pain is left-sided anterior chest wall aching in sensation last for a few minutes each time has no relation to exertion or any associated diaphoresis shortness of breath or nausea. In the ER chest x-ray was unremarkable and EKG was showing nonspecific ST-T changes. Patient's chest pain is reproducible on palpation. Patient has family history of patient's uncle and cousins having myocardial infarction at age 55. Patient has been admitted for further observation. Presently chest pain-free.   Hospital Course:   Atypical chest pain: Reproducible, with the left chest wall tenderness to deep palpation. Patient reported family history of MI at the age of 53 with her cousin and a paternal uncle. Patient was admitted for further workup. Cardiology was consulted. Serial troponins remained negative. D-dimer negative. 2-D echo showed EF of 60% with no regional  wall motion abnormalities. Per cardiology, Dr. Ron Parker, to complete her evaluation outpatient nuclear stress test can be done, office will arrange. I also spoke with [facility] coordinator, Trish prior to discharge. Patient had reproducible chest wall tenderness, placed on Medrol Dosepak and tramadol with PPI.  Hypothyroidism Continue Synthroid, TSH 1.16   Day of Discharge BP 117/72 mmHg  Pulse 78  Temp(Src) 98.4 F (36.9 C) (Oral)  Resp 15  Ht 5\' 4"  (1.626 m)  Wt 57.3 kg (126 lb 5.2 oz)  BMI 21.67 kg/m2  SpO2 100%  LMP 09/30/2010  Physical Exam: General: Alert and awake oriented x3 not in any acute distress. HEENT: anicteric sclera, pupils reactive to light and accommodation CVS: S1-S2 clear no  murmur rubs or gallops Chest: clear to auscultation bilaterally, no wheezing rales or rhonchi, chest wall tenderness. Abdomen: soft nontender, nondistended, normal bowel sounds Extremities: no cyanosis, clubbing or edema noted bilaterally Neuro: Cranial nerves II-XII intact, no focal neurological deficits   The results of significant diagnostics from this hospitalization (including imaging, microbiology, ancillary and laboratory) are listed below for reference.    LAB RESULTS: Basic Metabolic Panel:  Recent Labs Lab 12/02/14 1947 12/03/14 0528 12/03/14 0844  NA 137  --  140  K 3.4*  --  3.7  CL 106  --  107  CO2 21*  --  26  GLUCOSE 147*  --  125*  BUN <5*  --  5*  CREATININE 0.76 0.71 0.74  CALCIUM 8.8*  --  8.5*   Liver Function Tests:  Recent Labs Lab 12/03/14 0844  AST 16  ALT 10*  ALKPHOS 33*  BILITOT 0.7  PROT 7.1  ALBUMIN 3.9   No results for input(s): LIPASE, AMYLASE in the last 168 hours. No results for input(s): AMMONIA in the last 168 hours. CBC:  Recent Labs Lab 12/03/14 0528 12/03/14 0844  WBC 11.1* 8.9  NEUTROABS  --  6.6  HGB 14.7 16.0*  HCT 42.5 45.9  MCV 87.4 88.1  PLT 287 299   Cardiac Enzymes:  Recent Labs Lab 12/03/14 0528 12/03/14 0844  TROPONINI <0.03 <0.03   BNP: Invalid input(s): POCBNP CBG: No results for input(s): GLUCAP in the last 168 hours.  Significant Diagnostic Studies:  Dg Chest 2 View  12/02/2014   CLINICAL DATA:  Left-sided chest pain since last night.  EXAM: CHEST  2 VIEW  COMPARISON:  12/15/2011  FINDINGS: The heart size and mediastinal contours are within normal limits. Both lungs are clear. The visualized skeletal structures are unremarkable.  IMPRESSION: Normal exam.   Electronically Signed   By: Lorriane Shire M.D.   On: 12/02/2014 20:07    2D ECHO: Study Conclusions  - Left ventricle: The cavity size was normal. Wall thickness was normal. The estimated ejection fraction was 60%. Wall motion  was normal; there were no regional wall motion abnormalities. - Right ventricle: The cavity size was normal. Systolic function was normal.   Disposition and Follow-up:    DISPOSITION: Home   DISCHARGE FOLLOW-UP Follow-up Information    Follow up with NNODI, ADAKU, MD. Schedule an appointment as soon as possible for a visit in 10 days.   Specialty:  Family Medicine   Why:  for hospital follow-up   Contact information:   [address] [place] [address] [phone]       Follow up with Dola Argyle, MD.   Specialty:  Cardiology   Why:  office will call you for arranging the outpatient stress test  Contact information:   [address] [place] [address] [phone]        Time spent on Discharge: 32 minutes  Signed:   RAI,RIPUDEEP M.D. [facility] 12/03/2014, 1:48 PM Pager: [phone]

## 2014-12-19 ENCOUNTER — Ambulatory Visit: Payer: 59 | Admitting: Internal Medicine

## 2014-12-25 ENCOUNTER — Encounter: Payer: Self-pay | Admitting: *Deleted

## 2014-12-26 ENCOUNTER — Encounter: Payer: 59 | Admitting: Cardiology

## 2015-01-09 ENCOUNTER — Ambulatory Visit (INDEPENDENT_AMBULATORY_CARE_PROVIDER_SITE_OTHER): Payer: 59 | Admitting: Cardiology

## 2015-01-09 ENCOUNTER — Encounter: Payer: Self-pay | Admitting: Cardiology

## 2015-01-09 VITALS — BP 126/68 | HR 67 | Ht 64.0 in | Wt 130.3 lb

## 2015-01-09 DIAGNOSIS — R079 Chest pain, unspecified: Secondary | ICD-10-CM

## 2015-01-09 DIAGNOSIS — G43009 Migraine without aura, not intractable, without status migrainosus: Secondary | ICD-10-CM

## 2015-01-09 NOTE — Patient Instructions (Signed)
Your physician has requested that you have a lexiscan myoview. For further information please visit [email or web address]. Please follow instruction sheet, as given.  Your physician recommends that you schedule a follow-up appointment with Dr. Oval Linsey (first available) to review test results

## 2015-01-09 NOTE — Progress Notes (Signed)
Cardiology Office Note   Date:  01/09/2015   ID:  Jasmine [name], DOB Mar 09, 1966, MRN [id number]  PCP:  Anthoney Harada, MD  Cardiologist:  New- Dr. Oval Linsey    Chief Complaint  Patient presents with  . ER follow-up    patient reports occasional chest pain/pressure (5/10) - with radiation to her back, otherwise no complaints.      History of Present Illness: Jasmine [name] is a 49 y.o. female who presents for hospital follow up for chest pain. She was seen in the ER 12/03/14.  Neg troponins,  Neg d dimer.  Back to today.  Normal Echo : Study Conclusions  - Left ventricle: The cavity size was normal. Wall thickness was normal. The estimated ejection fraction was 60%. Wall motion was normal; there were no regional wall motion abnormalities. - Right ventricle: The cavity size was normal. Systolic function was normal.  Today she continues with chest pain mid sternal- less severe, she has not taken the protonix she was given.  With her FH of premature CAD in her cousins and her abnormal EKG Dr. Ron Parker recommended lexiscan myoview.   Lexiscan for ST changes.   Past Medical History  Diagnosis Date  . Hyperthyroidism     thyrodectomy 2006  . Blood transfusion     2 units transfuesd 2000  . Headache(784.0)     migraines - hormonal - last one 6/12 -OTC  . Acid reflux   . Helicobacter pylori ab+   . Thyroid cancer 2006    Past Surgical History  Procedure Laterality Date  . Breast lumpectomy Left 2009  . Myomectomy  2002  . Dilation and curettage, diagnostic / therapeutic  2002  . Abdominal hysterectomy  10/27/2010    Procedure: HYSTERECTOMY ABDOMINAL;  Surgeon: Marylynn Pearson;  Location: [facility];  Service: Gynecology;  Laterality: N/A;  . Thyroidectomy       Current Outpatient Prescriptions  Medication Sig Dispense Refill  . amitriptyline (ELAVIL) 10 MG tablet Take 20 mg by mouth at bedtime.    Marland Kitchen atenolol (TENORMIN) 50 MG tablet Take 50 mg by mouth at  bedtime.    . CHLORZOXAZONE PO Take 1 tablet by mouth daily as needed (migraines).    Marland Kitchen ibuprofen (ADVIL,MOTRIN) 600 MG tablet Take 600 mg by mouth daily as needed (pain).    Marland Kitchen levothyroxine (SYNTHROID, LEVOTHROID) 125 MCG tablet Take 125 mcg by mouth daily before breakfast.    . Multiple Vitamin (MULTIVITAMIN WITH MINERALS) TABS tablet Take 1 tablet by mouth daily with lunch.    . naratriptan (AMERGE) 2.5 MG tablet Take 2.5 mg by mouth See admin instructions. Take one (1) tablet at onset of headache; if returns or does not resolve, may repeat after 4 hours; do not exceed five (5) mg in 24 hours.    . Psyllium (METAMUCIL PO) Take 5 mLs by mouth daily.     No current facility-administered medications for this visit.    Allergies:   Review of patient's allergies indicates no known allergies.    Social History:  The patient  reports that she has never smoked. She has never used smokeless tobacco. She reports that she does not drink alcohol or use illicit drugs.   Family History:  The patient's family history includes CAD in her cousin and paternal uncle. There is no history of Colon cancer. premature hx of CAD.   ROS:  General:no colds or fevers, no weight changes Skin:no rashes or ulcers HEENT:no blurred vision, no congestion CV:see  HPI PUL:see HPI GI:no diarrhea constipation + melena just had colonoscopy with Dr. Benson Norway was normal, no indigestion GU:no hematuria, no dysuria MS:no joint pain, no claudication Neuro:no syncope, no lightheadedness, + migraines on atenolol for this.   Endo:no diabetes, + thyroid disease with hx of thyroid cancer.   Wt Readings from Last 3 Encounters:  01/09/15 130 lb 4.8 oz (59.104 kg)  12/03/14 126 lb 5.2 oz (57.3 kg)  07/04/13 126 lb (57.153 kg)     PHYSICAL EXAM: VS:  BP 126/68 mmHg  Pulse 67  Ht 5\' 4"  (1.626 m)  Wt 130 lb 4.8 oz (59.104 kg)  BMI 22.36 kg/m2  LMP 09/30/2010 , BMI Body mass index is 22.36 kg/(m^2). General:Pleasant affect,  NAD Skin:Warm and dry, brisk capillary refill HEENT:normocephalic, sclera clear, mucus membranes moist Neck:supple, no JVD, no bruits  Heart:S1S2 RRR without murmur, gallup, rub or click Lungs:clear without rales, rhonchi, or wheezes ZNB:VAPO, non tender, + BS, do not palpate liver spleen or masses Ext:no lower ext edema, 2+ pedal pulses, 2+ radial pulses Neuro:alert and oriented, MAE, follows commands, + facial symmetry    EKG:  EKG is ordered today. The ekg ordered today demonstrates SR with improved ST depression   Recent Labs: 12/03/2014: ALT 10*; BUN 5*; Creatinine, Ser 0.74; Hemoglobin 16.0*; Platelets 299; Potassium 3.7; Sodium 140; TSH 1.169    Lipid Panel No results found for: CHOL, TRIG, HDL, CHOLHDL, VLDL, LDLCALC, LDLDIRECT     Other studies Reviewed: Additional studies/ records that were reviewed today include: ER visit notes.    ASSESSMENT AND PLAN:  1.  Chest pain- neg troponin normal Echo but with abnormal EKG on ER visit it was recommended lexiscan myoview. Per Dr. Ron Parker  "Because she has resting ST abnormalities, a standard treadmill would not be helpful in her case. It is of note that the term myomectomy is used in her past medical history. This relates to fibroid removal procedures from her uterus in the past. She did not have a cardiac myomectomy." we will schedule, if positive would need cardiac cath.  She will follow up with Dr. Oval Linsey.  + FH premature CAD-- she will take her protonix for 2 weeks to see if makes a difference   2. Migraines and is on BB.     Current medicines are reviewed with the patient today.  The patient Has no concerns regarding medicines.  The following changes have been made:  See above Labs/ tests ordered today include:see above  Disposition:   FU:  see above  Signed, Isaiah Serge, NP  01/09/2015 1:46 PM    [facility] [place], [place], [place], [place] Phone: [phone]; Fax: [phone]

## 2015-01-10 ENCOUNTER — Telehealth (HOSPITAL_COMMUNITY): Payer: Self-pay | Admitting: Radiology

## 2015-01-10 NOTE — Telephone Encounter (Signed)
Encounter complete. 

## 2015-01-11 ENCOUNTER — Ambulatory Visit (HOSPITAL_COMMUNITY)
Admission: RE | Admit: 2015-01-11 | Discharge: 2015-01-11 | Disposition: A | Discharge status: Home / Self Care | Payer: 59 | Source: Ambulatory Visit | Attending: Cardiology | Admitting: Cardiology

## 2015-01-11 DIAGNOSIS — R079 Chest pain, unspecified: Secondary | ICD-10-CM | POA: Insufficient documentation

## 2015-01-11 DIAGNOSIS — Z8249 Family history of ischemic heart disease and other diseases of the circulatory system: Secondary | ICD-10-CM | POA: Insufficient documentation

## 2015-01-11 LAB — MYOCARDIAL PERFUSION IMAGING
CHL CUP STRESS STAGE 1 SBP: 127 mmHg
CHL CUP STRESS STAGE 1 SPEED: 0 mph
CHL CUP STRESS STAGE 2 GRADE: 0 %
CHL CUP STRESS STAGE 2 SPEED: 0 mph
CHL CUP STRESS STAGE 3 DBP: 83 mmHg
CHL CUP STRESS STAGE 3 GRADE: 0 %
CHL CUP STRESS STAGE 3 HR: 100 {beats}/min
CHL CUP STRESS STAGE 3 SBP: 158 mmHg
CHL CUP STRESS STAGE 4 DBP: 89 mmHg
CHL CUP STRESS STAGE 4 SBP: 133 mmHg
CSEPEW: 1 METS
CSEPPHR: 100 {beats}/min
CSEPPMHR: 58 %
LVDIAVOL: 74 mL
LVSYSVOL: 25 mL
Peak BP: 158 mmHg
Rest HR: 61 {beats}/min
SDS: 0
SRS: 0
SSS: 0
Stage 1 DBP: 98 mmHg
Stage 1 Grade: 0 %
Stage 1 HR: 67 {beats}/min
Stage 2 HR: 67 {beats}/min
Stage 3 Speed: 0 mph
Stage 4 Grade: 0 %
Stage 4 HR: 77 {beats}/min
Stage 4 Speed: 0 mph
TID: 0.99

## 2015-01-11 MED ORDER — TECHNETIUM TC 99M SESTAMIBI GENERIC - CARDIOLITE
10.3000 | Freq: Once | INTRAVENOUS | Status: AC | PRN
  Administered 2015-01-11: 10.3 via INTRAVENOUS

## 2015-01-11 MED ORDER — TECHNETIUM TC 99M SESTAMIBI GENERIC - CARDIOLITE
32.4000 | Freq: Once | INTRAVENOUS | Status: AC | PRN
  Administered 2015-01-11: 32.4 via INTRAVENOUS

## 2015-01-11 MED ORDER — REGADENOSON 0.4 MG/5ML IV SOLN
0.4000 mg | Freq: Once | INTRAVENOUS | Status: AC
  Administered 2015-01-11: 0.4 mg via INTRAVENOUS

## 2015-01-14 ENCOUNTER — Telehealth: Payer: Self-pay | Admitting: *Deleted

## 2015-01-14 NOTE — Telephone Encounter (Signed)
-----   Message from Isaiah Serge, NP sent at 01/14/2015  8:11 AM EDT ----- Negative stress test.  Keep appt with Dr. Oval Linsey.

## 2015-01-14 NOTE — Telephone Encounter (Signed)
Spoke to patient. Result given . Verbalized understanding Keep appointment . Change to 01/29/15 .

## 2015-01-14 NOTE — Telephone Encounter (Signed)
Left message to call back regarding stress test.  

## 2015-01-28 NOTE — Progress Notes (Signed)
Cardiology Office Note   Date:  01/29/2015   ID:  Jasmine [name], DOB 1965/09/28, MRN [id number]  PCP:  Anthoney Harada, MD  Cardiologist:   Sharol Harness, MD   Chief Complaint  Patient presents with  . Advice Only    here to discuss lexiscan results      History of Present Illness: Jasmine [name] is a 49 y.o. female with hyperthyroidism and thyroid cancer who presents for follow up on a stress test.  Jasmine [name] was seen by Cecilie Kicks on 01/09/15 after being seen in the ED with chest pain on 12/03/14.  She had episodes of chest pressure that din't go away over the course of one day.  It started radiating to her back and her hands felt numb.  After that appointment she was referred for nuclear stress testing which was negative for ischemia.  She was started on a two week trial of a PPI and was referred to follow up in cardiology due to her family history of premature cardiac disease.  She only took the PPI for a few days and did not notice a difference in her symptoms.  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  She and her PCP think that her chest pain may have been related to straining at work.  She does not get much formal exercise but is active at work.  He prescribed meloxicam, but she has not taken it often and is unsure of whether it works.  Jasmine [name] also reports and episode of pre-sycnocpe.  It was at the end of a 12 hour shift.  She had eaten lunch and a light snack later in the shift.  She felt as if she were going to faint but did not.  There was no associated chest pain, shortness of breath, or palpitaitons.  She noticed that her hands felt cold and clammy.  She does not check her blood pressure at the time. She waited around for an hour before finally deciding she was okay to drive home. She's not had a recurrent episode since then.    Past Medical History  Diagnosis Date  . Hyperthyroidism     thyrodectomy 2006  . Blood transfusion     2  units transfuesd 2000  . Headache(784.0)     migraines - hormonal - last one 6/12 -OTC  . Acid reflux   . Helicobacter pylori ab+   . Thyroid cancer ([place]) 2006    Past Surgical History  Procedure Laterality Date  . Breast lumpectomy Left 2009  . Myomectomy  2002  . Dilation and curettage, diagnostic / therapeutic  2002  . Abdominal hysterectomy  10/27/2010    Procedure: HYSTERECTOMY ABDOMINAL;  Surgeon: Marylynn Pearson;  Location: [facility];  Service: Gynecology;  Laterality: N/A;  . Thyroidectomy       Current Outpatient Prescriptions  Medication Sig Dispense Refill  . amitriptyline (ELAVIL) 10 MG tablet Take 20 mg by mouth at bedtime.    Marland Kitchen atenolol (TENORMIN) 50 MG tablet Take 50 mg by mouth at bedtime.    . CHLORZOXAZONE PO Take 1 tablet by mouth daily as needed (migraines).    Marland Kitchen ibuprofen (ADVIL,MOTRIN) 600 MG tablet Take 600 mg by mouth daily as needed (pain).    Marland Kitchen levothyroxine (SYNTHROID, LEVOTHROID) 125 MCG tablet Take 125 mcg by mouth daily before breakfast.    . Multiple Vitamin (MULTIVITAMIN WITH MINERALS) TABS tablet Take 1 tablet by mouth daily with lunch.    Marland Kitchen  naratriptan (AMERGE) 2.5 MG tablet Take 2.5 mg by mouth See admin instructions. Take one (1) tablet at onset of headache; if returns or does not resolve, may repeat after 4 hours; do not exceed five (5) mg in 24 hours.    . polyethylene glycol (MIRALAX / GLYCOLAX) packet Take 17 g by mouth daily as needed.    . Psyllium (METAMUCIL PO) Take 5 mLs by mouth as needed.      No current facility-administered medications for this visit.    Allergies:   Review of patient's allergies indicates no known allergies.    Social History:  The patient  reports that she has never smoked. She has never used smokeless tobacco. She reports that she does not drink alcohol or use illicit drugs.   Family History:  The patient's family history includes CAD in her cousin and paternal uncle. There is no history of Colon cancer.    ROS:   Please see the history of present illness.   Otherwise, review of systems are positive for none.   All other systems are reviewed and negative.    PHYSICAL EXAM: VS:  BP 125/80 mmHg  Pulse 69  Ht 5\' 4"  (1.626 m)  Wt 60.238 kg (132 lb 12.8 oz)  BMI 22.78 kg/m2  LMP 09/30/2010 , BMI Body mass index is 22.78 kg/(m^2). GENERAL:  Well appearing HEENT:  Pupils equal round and reactive, fundi not visualized, oral mucosa unremarkable NECK:  No jugular venous distention, waveform within normal limits, carotid upstroke brisk and symmetric, no bruits, no thyromegaly LYMPHATICS:  No cervical adenopathy LUNGS:  Clear to auscultation bilaterally HEART:  RRR.  PMI not displaced or sustained,S1 and S2 within normal limits, no S3, no S4, no clicks, no rubs, no murmurs ABD:  Flat, positive bowel sounds normal in frequency in pitch, no bruits, no rebound, no guarding, no midline pulsatile mass, no hepatomegaly, no splenomegaly EXT:  2 plus pulses throughout, no edema, no cyanosis no clubbing SKIN:  No rashes no nodules NEURO:  Cranial nerves II through XII grossly intact, motor grossly intact throughout PSYCH:  Cognitively intact, oriented to person place and time    EKG:  EKG is not ordered today.   Recent Labs: 12/03/2014: ALT 10*; BUN 5*; Creatinine, Ser 0.74; Hemoglobin 16.0*; Platelets 299; Potassium 3.7; Sodium 140; TSH 1.169    Lipid Panel No results found for: CHOL, TRIG, HDL, CHOLHDL, VLDL, LDLCALC, LDLDIRECT    Wt Readings from Last 3 Encounters:  01/29/15 60.238 kg (132 lb 12.8 oz)  01/11/15 58.968 kg (130 lb)  01/09/15 59.104 kg (130 lb 4.8 oz)    Echo 12/03/14: Normal Echo : Study Conclusions  - Left ventricle: The cavity size was normal. Wall thickness was normal. The estimated ejection fraction was 60%. Wall motion was normal; there were no regional wall motion abnormalities. - Right ventricle: The cavity size was normal. Systolic function was normal.  Nuclear  Stress 01/11/15:  Nuclear stress EF: 66%.  The left ventricular ejection fraction is hyperdynamic (>65%).  There was no ST segment deviation noted during stress.  No T wave inversion was noted during stress.  The study is normal.  This is a low risk study.  ASSESSMENT AND PLAN:  # Non-cardiac chest pain: Jasmine [name] symptoms are atypical and her stress test was negative.  This is very reassuring that she does not have a cardiac etiology for her symptoms. Her symptoms sound more musculoskeletal in nature. We agree with her PCP that an NSAID is  the best course of treatment. I recommended that she take her meloxicam daily for 7 days and see if this helps her symptoms.    # CV Disease Prevention: She reports that her lipids were recently checked by her PCP. Therefore we will not repeat them at this time.   Current medicines are reviewed at length with the patient today.  The patient does not have concerns regarding medicines.  The following changes have been made:  no change  Labs/ tests ordered today include:  No orders of the defined types were placed in this encounter.     Disposition:   FU with Mattew Chriswell C. Oval Linsey, MD as needed.    Signed, Sharol Harness, MD  01/29/2015 9:40 AM    [place]

## 2015-01-29 ENCOUNTER — Ambulatory Visit (INDEPENDENT_AMBULATORY_CARE_PROVIDER_SITE_OTHER): Payer: 59 | Admitting: Cardiovascular Disease

## 2015-01-29 ENCOUNTER — Encounter: Payer: Self-pay | Admitting: Cardiovascular Disease

## 2015-01-29 VITALS — BP 125/80 | HR 69 | Ht 64.0 in | Wt 132.8 lb

## 2015-01-29 DIAGNOSIS — R0789 Other chest pain: Secondary | ICD-10-CM | POA: Diagnosis not present

## 2015-01-29 NOTE — Patient Instructions (Signed)
No changes with current medications  Your physician recommends that you schedule a follow-up appointment  As needed.

## 2015-01-30 ENCOUNTER — Ambulatory Visit: Payer: 59 | Admitting: Cardiovascular Disease

## 2015-04-23 MED FILL — NARATRIPTAN HCL 2.5 MG TAB: 2.5 | 30 days supply | Qty: 9 | Fill #2

## 2015-04-23 MED FILL — AMITRIPTYLINE HCL 10 MG TAB: 10 | 30 days supply | Qty: 120 | Fill #2

## 2015-04-23 MED FILL — POLYETHYLENE GLYCOL 3350 PO: 30 days supply | Qty: 527 | Fill #0

## 2015-04-23 MED FILL — CHLORZOXAZONE 500 MG TABLET: 500 | 10 days supply | Qty: 30 | Fill #1

## 2015-04-23 MED FILL — ATENOLOL 25 MG TABLET: 25 | 30 days supply | Qty: 60 | Fill #2

## 2015-05-24 MED FILL — NARATRIPTAN HCL 2.5 MG TAB: 2.5 | 30 days supply | Qty: 9 | Fill #3

## 2015-05-24 MED FILL — ATENOLOL 25 MG TABLET: 25 | 30 days supply | Qty: 60 | Fill #3

## 2015-05-29 DIAGNOSIS — G43009 Migraine without aura, not intractable, without status migrainosus: Secondary | ICD-10-CM | POA: Diagnosis not present

## 2015-05-29 DIAGNOSIS — G44219 Episodic tension-type headache, not intractable: Secondary | ICD-10-CM | POA: Diagnosis not present

## 2015-05-29 DIAGNOSIS — G4700 Insomnia, unspecified: Secondary | ICD-10-CM | POA: Diagnosis not present

## 2015-06-05 MED FILL — IBUPROFEN 600 MG TABLET: 600 | 4 days supply | Qty: 16 | Fill #0

## 2015-06-05 MED FILL — AMOXICILLIN 500 MG CAPSULE: 500 | 6 days supply | Qty: 25 | Fill #0

## 2015-06-05 MED FILL — HYDROCODON-APAP 5-325: 5-325 | 2 days supply | Qty: 16 | Fill #0

## 2015-06-14 MED FILL — AMITRIPTYLINE HCL 10 MG TAB: 10 | 30 days supply | Qty: 120 | Fill #0

## 2015-06-14 MED FILL — CHLORZOXAZONE 500 MG TABLET: 500 | 20 days supply | Qty: 60 | Fill #0

## 2015-06-18 DIAGNOSIS — E890 Postprocedural hypothyroidism: Secondary | ICD-10-CM | POA: Diagnosis not present

## 2015-06-24 MED FILL — METAMUCIL POWDER: 28.3 | 30 days supply | Qty: 1368 | Fill #1

## 2015-06-24 MED FILL — SYNTHROID 125 MCG TABLET: 125 | 90 days supply | Qty: 90 | Fill #1

## 2015-06-24 MED FILL — NARATRIPTAN HCL 2.5 MG TAB: 2.5 | 60 days supply | Qty: 18 | Fill #0

## 2015-06-25 DIAGNOSIS — C73 Malignant neoplasm of thyroid gland: Secondary | ICD-10-CM | POA: Diagnosis not present

## 2015-06-25 DIAGNOSIS — R7309 Other abnormal glucose: Secondary | ICD-10-CM | POA: Diagnosis not present

## 2015-06-25 DIAGNOSIS — G43909 Migraine, unspecified, not intractable, without status migrainosus: Secondary | ICD-10-CM | POA: Diagnosis not present

## 2015-06-25 DIAGNOSIS — R0789 Other chest pain: Secondary | ICD-10-CM | POA: Diagnosis not present

## 2015-06-25 DIAGNOSIS — E785 Hyperlipidemia, unspecified: Secondary | ICD-10-CM | POA: Diagnosis not present

## 2015-06-25 DIAGNOSIS — K5900 Constipation, unspecified: Secondary | ICD-10-CM | POA: Diagnosis not present

## 2015-06-30 DIAGNOSIS — J209 Acute bronchitis, unspecified: Secondary | ICD-10-CM | POA: Diagnosis not present

## 2015-06-30 DIAGNOSIS — R509 Fever, unspecified: Secondary | ICD-10-CM | POA: Diagnosis not present

## 2015-06-30 DIAGNOSIS — J019 Acute sinusitis, unspecified: Secondary | ICD-10-CM | POA: Diagnosis not present

## 2015-07-03 MED FILL — BENZONATATE 100 MG CAPSULE: 100 | 10 days supply | Qty: 30 | Fill #0

## 2015-07-15 MED FILL — AMITRIPTYLINE HCL 10 MG TAB: 10 | 30 days supply | Qty: 120 | Fill #1

## 2015-07-17 ENCOUNTER — Ambulatory Visit (HOSPITAL_COMMUNITY)
Admission: EM | Admit: 2015-07-17 | Discharge: 2015-07-19 | Disposition: A | Discharge status: Home / Self Care | Payer: 59 | Attending: Surgery | Admitting: Surgery

## 2015-07-17 ENCOUNTER — Encounter (HOSPITAL_COMMUNITY): Payer: Self-pay | Admitting: *Deleted

## 2015-07-17 DIAGNOSIS — K219 Gastro-esophageal reflux disease without esophagitis: Secondary | ICD-10-CM | POA: Insufficient documentation

## 2015-07-17 DIAGNOSIS — R1084 Generalized abdominal pain: Secondary | ICD-10-CM | POA: Diagnosis not present

## 2015-07-17 DIAGNOSIS — R1031 Right lower quadrant pain: Secondary | ICD-10-CM | POA: Diagnosis not present

## 2015-07-17 DIAGNOSIS — Z85850 Personal history of malignant neoplasm of thyroid: Secondary | ICD-10-CM | POA: Insufficient documentation

## 2015-07-17 DIAGNOSIS — E0590 Thyrotoxicosis, unspecified without thyrotoxic crisis or storm: Secondary | ICD-10-CM | POA: Insufficient documentation

## 2015-07-17 DIAGNOSIS — Z90710 Acquired absence of both cervix and uterus: Secondary | ICD-10-CM | POA: Diagnosis not present

## 2015-07-17 DIAGNOSIS — N1330 Unspecified hydronephrosis: Secondary | ICD-10-CM | POA: Insufficient documentation

## 2015-07-17 DIAGNOSIS — K3580 Unspecified acute appendicitis: Secondary | ICD-10-CM | POA: Insufficient documentation

## 2015-07-17 DIAGNOSIS — K353 Acute appendicitis with localized peritonitis, without perforation or gangrene: Secondary | ICD-10-CM | POA: Diagnosis present

## 2015-07-17 HISTORY — DX: Hypothyroidism, unspecified: E03.9

## 2015-07-17 HISTORY — DX: Migraine, unspecified, not intractable, without status migrainosus: G43.909

## 2015-07-17 LAB — COMPREHENSIVE METABOLIC PANEL
ALBUMIN: 3.9 g/dL (ref 3.5–5.0)
ALK PHOS: 41 U/L (ref 38–126)
ALT: 17 U/L (ref 14–54)
ANION GAP: 11 (ref 5–15)
AST: 22 U/L (ref 15–41)
BILIRUBIN TOTAL: 1 mg/dL (ref 0.3–1.2)
BUN: 7 mg/dL (ref 6–20)
CALCIUM: 8.8 mg/dL — AB (ref 8.9–10.3)
CO2: 28 mmol/L (ref 22–32)
Chloride: 97 mmol/L — ABNORMAL LOW (ref 101–111)
Creatinine, Ser: 0.78 mg/dL (ref 0.44–1.00)
GFR calc Af Amer: >60 mL/min (ref >60)
GFR calc non Af Amer: >60 mL/min (ref >60)
GLUCOSE: 132 mg/dL — AB (ref 65–99)
Potassium: 3.7 mmol/L (ref 3.5–5.1)
SODIUM: 136 mmol/L (ref 135–145)
TOTAL PROTEIN: 7.5 g/dL (ref 6.5–8.1)

## 2015-07-17 LAB — URINALYSIS, ROUTINE W REFLEX MICROSCOPIC
BILIRUBIN URINE: NEGATIVE
Glucose, UA: NEGATIVE mg/dL
Hgb urine dipstick: NEGATIVE
KETONES UR: 15 mg/dL — AB
Leukocytes, UA: NEGATIVE
NITRITE: NEGATIVE
PROTEIN: NEGATIVE mg/dL
Specific Gravity, Urine: 1.013 (ref 1.005–1.030)
pH: 6.5 (ref 5.0–8.0)

## 2015-07-17 LAB — CBC
HCT: 42.7 % (ref 36.0–46.0)
HEMOGLOBIN: 14.4 g/dL (ref 12.0–15.0)
MCH: 30.1 pg (ref 26.0–34.0)
MCHC: 33.7 g/dL (ref 30.0–36.0)
MCV: 89.3 fL (ref 78.0–100.0)
Platelets: 328 10*3/uL (ref 150–400)
RBC: 4.78 MIL/uL (ref 3.87–5.11)
RDW: 12.7 % (ref 11.5–15.5)
WBC: 16.6 10*3/uL — ABNORMAL HIGH (ref 4.0–10.5)

## 2015-07-17 LAB — LIPASE, BLOOD: Lipase: 33 U/L (ref 11–51)

## 2015-07-17 NOTE — ED Notes (Signed)
The pt has had abd pain since yesterfday  Worse today  No nv or diarrhea

## 2015-07-18 ENCOUNTER — Emergency Department (HOSPITAL_COMMUNITY): Payer: 59

## 2015-07-18 ENCOUNTER — Observation Stay (HOSPITAL_COMMUNITY): Payer: 59 | Admitting: Anesthesiology

## 2015-07-18 ENCOUNTER — Encounter (HOSPITAL_COMMUNITY): Payer: Self-pay | Admitting: Certified Registered Nurse Anesthetist

## 2015-07-18 ENCOUNTER — Encounter (HOSPITAL_COMMUNITY)
Admission: EM | Disposition: A | Discharge status: Home / Self Care | Payer: Self-pay | Source: Home / Self Care | Attending: Emergency Medicine

## 2015-07-18 DIAGNOSIS — K219 Gastro-esophageal reflux disease without esophagitis: Secondary | ICD-10-CM | POA: Diagnosis not present

## 2015-07-18 DIAGNOSIS — K3580 Unspecified acute appendicitis: Secondary | ICD-10-CM | POA: Diagnosis present

## 2015-07-18 DIAGNOSIS — R1031 Right lower quadrant pain: Secondary | ICD-10-CM | POA: Diagnosis not present

## 2015-07-18 DIAGNOSIS — K353 Acute appendicitis with localized peritonitis, without perforation or gangrene: Secondary | ICD-10-CM | POA: Diagnosis present

## 2015-07-18 DIAGNOSIS — Z85850 Personal history of malignant neoplasm of thyroid: Secondary | ICD-10-CM | POA: Diagnosis not present

## 2015-07-18 DIAGNOSIS — E0590 Thyrotoxicosis, unspecified without thyrotoxic crisis or storm: Secondary | ICD-10-CM | POA: Diagnosis not present

## 2015-07-18 DIAGNOSIS — Z90710 Acquired absence of both cervix and uterus: Secondary | ICD-10-CM | POA: Diagnosis not present

## 2015-07-18 DIAGNOSIS — N1330 Unspecified hydronephrosis: Secondary | ICD-10-CM | POA: Diagnosis not present

## 2015-07-18 HISTORY — PX: APPENDECTOMY: SHX54

## 2015-07-18 HISTORY — PX: LAPAROSCOPIC APPENDECTOMY: SHX408

## 2015-07-18 LAB — CBC
HCT: 43.1 % (ref 36.0–46.0)
Hemoglobin: 14.6 g/dL (ref 12.0–15.0)
MCH: 30.3 pg (ref 26.0–34.0)
MCHC: 33.9 g/dL (ref 30.0–36.0)
MCV: 89.4 fL (ref 78.0–100.0)
Platelets: 270 K/uL (ref 150–400)
RBC: 4.82 MIL/uL (ref 3.87–5.11)
RDW: 12.9 % (ref 11.5–15.5)
WBC: 22.5 K/uL — ABNORMAL HIGH (ref 4.0–10.5)

## 2015-07-18 LAB — CREATININE, SERUM
Creatinine, Ser: 0.84 mg/dL (ref 0.44–1.00)
GFR calc Af Amer: >60 mL/min (ref >60)
GFR calc non Af Amer: >60 mL/min (ref >60)

## 2015-07-18 SURGERY — APPENDECTOMY, LAPAROSCOPIC
Anesthesia: General | Site: Abdomen

## 2015-07-18 MED ORDER — LIDOCAINE HCL (CARDIAC) 20 MG/ML IV SOLN
INTRAVENOUS | Status: DC | PRN
  Administered 2015-07-18: 100 mg via INTRAVENOUS

## 2015-07-18 MED ORDER — DEXTROSE 5 % IV SOLN
2.0000 g | INTRAVENOUS | Status: DC
  Administered 2015-07-18: 2 g via INTRAVENOUS
  Filled 2015-07-18 (×2): qty 2

## 2015-07-18 MED ORDER — IOHEXOL 300 MG/ML  SOLN
100.0000 mL | Freq: Once | INTRAMUSCULAR | Status: AC | PRN
  Administered 2015-07-18: 100 mL via INTRAVENOUS

## 2015-07-18 MED ORDER — CHLORZOXAZONE 500 MG PO TABS
500.0000 mg | ORAL_TABLET | Freq: Four times a day (QID) | ORAL | Status: DC | PRN
  Filled 2015-07-18: qty 1

## 2015-07-18 MED ORDER — ONDANSETRON 4 MG PO TBDP
4.0000 mg | ORAL_TABLET | Freq: Four times a day (QID) | ORAL | Status: DC | PRN
  Filled 2015-07-18: qty 1

## 2015-07-18 MED ORDER — MIDAZOLAM HCL 5 MG/5ML IJ SOLN
INTRAMUSCULAR | Status: DC | PRN
  Administered 2015-07-18: 2 mg via INTRAVENOUS

## 2015-07-18 MED ORDER — BUPIVACAINE-EPINEPHRINE (PF) 0.25% -1:200000 IJ SOLN
INTRAMUSCULAR | Status: AC
  Filled 2015-07-18: qty 30

## 2015-07-18 MED ORDER — DIPHENHYDRAMINE HCL 50 MG/ML IJ SOLN: 12.5000 mg | Freq: Four times a day (QID) | INTRAMUSCULAR | Status: DC | PRN

## 2015-07-18 MED ORDER — BUPIVACAINE-EPINEPHRINE 0.25% -1:200000 IJ SOLN
INTRAMUSCULAR | Status: DC | PRN
  Administered 2015-07-18: 10 mL

## 2015-07-18 MED ORDER — KETOROLAC TROMETHAMINE 30 MG/ML IJ SOLN
INTRAMUSCULAR | Status: AC
  Filled 2015-07-18: qty 1

## 2015-07-18 MED ORDER — FENTANYL CITRATE (PF) 250 MCG/5ML IJ SOLN
INTRAMUSCULAR | Status: AC
  Filled 2015-07-18: qty 5

## 2015-07-18 MED ORDER — AMITRIPTYLINE HCL 10 MG PO TABS
40.0000 mg | ORAL_TABLET | Freq: Every day | ORAL | Status: DC
  Administered 2015-07-18: 40 mg via ORAL
  Filled 2015-07-18 (×2): qty 4

## 2015-07-18 MED ORDER — HYDROCODONE-ACETAMINOPHEN 5-325 MG PO TABS
1.0000 | ORAL_TABLET | ORAL | Status: DC | PRN
  Administered 2015-07-18: 1 via ORAL
  Administered 2015-07-18: 2 via ORAL
  Administered 2015-07-19 (×2): 1 via ORAL
  Filled 2015-07-18: qty 2
  Filled 2015-07-18 (×3): qty 1

## 2015-07-18 MED ORDER — KCL IN DEXTROSE-NACL 10-5-0.45 MEQ/L-%-% IV SOLN
INTRAVENOUS | Status: DC
  Administered 2015-07-18: 18:00:00 via INTRAVENOUS
  Filled 2015-07-18 (×2): qty 1000

## 2015-07-18 MED ORDER — PHENYLEPHRINE 40 MCG/ML (10ML) SYRINGE FOR IV PUSH (FOR BLOOD PRESSURE SUPPORT)
PREFILLED_SYRINGE | INTRAVENOUS | Status: AC
  Filled 2015-07-18: qty 10

## 2015-07-18 MED ORDER — SODIUM CHLORIDE 0.9 % IV BOLUS (SEPSIS)
1000.0000 mL | Freq: Once | INTRAVENOUS | Status: AC
  Administered 2015-07-18: 1000 mL via INTRAVENOUS

## 2015-07-18 MED ORDER — HYDROMORPHONE HCL 1 MG/ML IJ SOLN: 1.0000 mg | INTRAMUSCULAR | Status: DC | PRN

## 2015-07-18 MED ORDER — POLYETHYLENE GLYCOL 3350 17 G PO PACK
17.0000 g | PACK | Freq: Every day | ORAL | Status: DC | PRN
Start: 2015-07-18 — End: 2015-07-19
  Administered 2015-07-18: 17 g via ORAL
  Filled 2015-07-18: qty 1

## 2015-07-18 MED ORDER — ENOXAPARIN SODIUM 40 MG/0.4ML ~~LOC~~ SOLN
40.0000 mg | SUBCUTANEOUS | Status: DC
  Administered 2015-07-19: 40 mg via SUBCUTANEOUS
  Filled 2015-07-18: qty 0.4

## 2015-07-18 MED ORDER — DEXAMETHASONE SODIUM PHOSPHATE 4 MG/ML IJ SOLN
INTRAMUSCULAR | Status: AC
  Filled 2015-07-18: qty 2

## 2015-07-18 MED ORDER — ROCURONIUM BROMIDE 100 MG/10ML IV SOLN
INTRAVENOUS | Status: DC | PRN
  Administered 2015-07-18: 25 mg via INTRAVENOUS
  Administered 2015-07-18: 10 mg via INTRAVENOUS

## 2015-07-18 MED ORDER — FENTANYL CITRATE (PF) 100 MCG/2ML IJ SOLN
INTRAMUSCULAR | Status: DC | PRN
  Administered 2015-07-18 (×3): 50 ug via INTRAVENOUS

## 2015-07-18 MED ORDER — MINERAL OIL LIGHT 100 % EX OIL
TOPICAL_OIL | CUTANEOUS | Status: DC | PRN
  Administered 2015-07-18: 1 via TOPICAL

## 2015-07-18 MED ORDER — OXYCODONE-ACETAMINOPHEN 5-325 MG PO TABS: 1.0000 | ORAL_TABLET | ORAL | Status: DC | PRN

## 2015-07-18 MED ORDER — ONDANSETRON HCL 4 MG/2ML IJ SOLN: 4.0000 mg | Freq: Four times a day (QID) | INTRAMUSCULAR | Status: DC | PRN

## 2015-07-18 MED ORDER — PROPOFOL 10 MG/ML IV BOLUS
INTRAVENOUS | Status: DC | PRN
  Administered 2015-07-18: 200 mg via INTRAVENOUS

## 2015-07-18 MED ORDER — HYDRALAZINE HCL 20 MG/ML IJ SOLN: 10.0000 mg | INTRAMUSCULAR | Status: DC | PRN

## 2015-07-18 MED ORDER — PROPOFOL 10 MG/ML IV BOLUS
INTRAVENOUS | Status: AC
  Filled 2015-07-18: qty 20

## 2015-07-18 MED ORDER — HYDROMORPHONE HCL 1 MG/ML IJ SOLN
0.5000 mg | INTRAMUSCULAR | Status: DC | PRN
  Administered 2015-07-18: 1 mg via INTRAVENOUS
  Filled 2015-07-18: qty 1

## 2015-07-18 MED ORDER — METRONIDAZOLE IN NACL 5-0.79 MG/ML-% IV SOLN
500.0000 mg | Freq: Three times a day (TID) | INTRAVENOUS | Status: DC
  Administered 2015-07-18 – 2015-07-19 (×3): 500 mg via INTRAVENOUS
  Filled 2015-07-18 (×5): qty 100

## 2015-07-18 MED ORDER — ENOXAPARIN SODIUM 40 MG/0.4ML ~~LOC~~ SOLN: 40.0000 mg | SUBCUTANEOUS | Status: DC

## 2015-07-18 MED ORDER — HYDROMORPHONE HCL 1 MG/ML IJ SOLN
1.0000 mg | Freq: Once | INTRAMUSCULAR | Status: AC
  Administered 2015-07-18: 1 mg via INTRAVENOUS
  Filled 2015-07-18: qty 1

## 2015-07-18 MED ORDER — PIPERACILLIN-TAZOBACTAM 3.375 G IVPB
3.3750 g | INTRAVENOUS | Status: AC
  Filled 2015-07-18: qty 50

## 2015-07-18 MED ORDER — SUGAMMADEX SODIUM 200 MG/2ML IV SOLN
INTRAVENOUS | Status: AC
  Filled 2015-07-18: qty 2

## 2015-07-18 MED ORDER — ONDANSETRON HCL 4 MG/2ML IJ SOLN
4.0000 mg | Freq: Four times a day (QID) | INTRAMUSCULAR | Status: DC | PRN
  Filled 2015-07-18: qty 2

## 2015-07-18 MED ORDER — POTASSIUM CHLORIDE IN NACL 20-0.9 MEQ/L-% IV SOLN
INTRAVENOUS | Status: DC
  Filled 2015-07-18 (×2): qty 1000

## 2015-07-18 MED ORDER — SUCCINYLCHOLINE CHLORIDE 20 MG/ML IJ SOLN
INTRAMUSCULAR | Status: AC
  Filled 2015-07-18: qty 1

## 2015-07-18 MED ORDER — SUCCINYLCHOLINE CHLORIDE 20 MG/ML IJ SOLN
INTRAMUSCULAR | Status: DC | PRN
  Administered 2015-07-18: 80 mg via INTRAVENOUS

## 2015-07-18 MED ORDER — MIDAZOLAM HCL 2 MG/2ML IJ SOLN
INTRAMUSCULAR | Status: AC
  Filled 2015-07-18: qty 2

## 2015-07-18 MED ORDER — ONDANSETRON HCL 4 MG/2ML IJ SOLN: 4.0000 mg | Freq: Once | INTRAMUSCULAR | Status: DC

## 2015-07-18 MED ORDER — PHENYLEPHRINE HCL 10 MG/ML IJ SOLN
INTRAMUSCULAR | Status: DC | PRN
  Administered 2015-07-18 (×3): 80 ug via INTRAVENOUS

## 2015-07-18 MED ORDER — DIPHENHYDRAMINE HCL 12.5 MG/5ML PO ELIX: 12.5000 mg | ORAL_SOLUTION | Freq: Four times a day (QID) | ORAL | Status: DC | PRN

## 2015-07-18 MED ORDER — ACETAMINOPHEN 650 MG RE SUPP: 650.0000 mg | Freq: Four times a day (QID) | RECTAL | Status: DC | PRN

## 2015-07-18 MED ORDER — ONDANSETRON HCL 4 MG/2ML IJ SOLN
4.0000 mg | Freq: Once | INTRAMUSCULAR | Status: AC
  Administered 2015-07-18: 4 mg via INTRAVENOUS
  Filled 2015-07-18: qty 2

## 2015-07-18 MED ORDER — ROCURONIUM BROMIDE 50 MG/5ML IV SOLN
INTRAVENOUS | Status: AC
  Filled 2015-07-18: qty 1

## 2015-07-18 MED ORDER — PIPERACILLIN-TAZOBACTAM 3.375 G IVPB
3.3750 g | Freq: Three times a day (TID) | INTRAVENOUS | Status: DC
  Administered 2015-07-18: 3.375 g via INTRAVENOUS
  Filled 2015-07-18 (×2): qty 50

## 2015-07-18 MED ORDER — ONDANSETRON 4 MG PO TBDP: 4.0000 mg | ORAL_TABLET | Freq: Four times a day (QID) | ORAL | Status: DC | PRN

## 2015-07-18 MED ORDER — ACETAMINOPHEN 325 MG PO TABS: 650.0000 mg | ORAL_TABLET | Freq: Four times a day (QID) | ORAL | Status: DC | PRN

## 2015-07-18 MED ORDER — METHOCARBAMOL 500 MG PO TABS
500.0000 mg | ORAL_TABLET | Freq: Four times a day (QID) | ORAL | Status: DC | PRN
  Administered 2015-07-18: 500 mg via ORAL
  Filled 2015-07-18: qty 1

## 2015-07-18 MED ORDER — LIDOCAINE HCL (CARDIAC) 20 MG/ML IV SOLN
INTRAVENOUS | Status: AC
  Filled 2015-07-18: qty 5

## 2015-07-18 MED ORDER — LACTATED RINGERS IV SOLN
INTRAVENOUS | Status: DC | PRN
  Administered 2015-07-18: 08:00:00 via INTRAVENOUS

## 2015-07-18 MED ORDER — MINERAL OIL LIGHT 100 % EX OIL
TOPICAL_OIL | CUTANEOUS | Status: AC
  Filled 2015-07-18: qty 25

## 2015-07-18 MED ORDER — SODIUM CHLORIDE 0.9 % IR SOLN
Status: DC | PRN
  Administered 2015-07-18: 1000 mL

## 2015-07-18 MED ORDER — DOCUSATE SODIUM 100 MG PO CAPS
100.0000 mg | ORAL_CAPSULE | Freq: Two times a day (BID) | ORAL | Status: DC
  Administered 2015-07-18 – 2015-07-19 (×3): 100 mg via ORAL
  Filled 2015-07-18 (×3): qty 1

## 2015-07-18 MED ORDER — ONDANSETRON HCL 4 MG/2ML IJ SOLN
INTRAMUSCULAR | Status: AC
  Filled 2015-07-18: qty 2

## 2015-07-18 MED ORDER — LEVOTHYROXINE SODIUM 25 MCG PO TABS
125.0000 ug | ORAL_TABLET | Freq: Every day | ORAL | Status: DC
  Administered 2015-07-19: 125 ug via ORAL
  Filled 2015-07-18: qty 1

## 2015-07-18 MED ORDER — DEXAMETHASONE SODIUM PHOSPHATE 4 MG/ML IJ SOLN
INTRAMUSCULAR | Status: DC | PRN
  Administered 2015-07-18: 8 mg via INTRAVENOUS

## 2015-07-18 MED ORDER — PIPERACILLIN-TAZOBACTAM 3.375 G IVPB 30 MIN
3.3750 g | Freq: Once | INTRAVENOUS | Status: AC
  Administered 2015-07-18: 3.375 g via INTRAVENOUS
  Filled 2015-07-18: qty 50

## 2015-07-18 MED ORDER — ONDANSETRON HCL 4 MG/2ML IJ SOLN
INTRAMUSCULAR | Status: DC | PRN
  Administered 2015-07-18: 4 mg via INTRAVENOUS

## 2015-07-18 MED ORDER — 0.9 % SODIUM CHLORIDE (POUR BTL) OPTIME
TOPICAL | Status: DC | PRN
  Administered 2015-07-18: 1000 mL

## 2015-07-18 MED ORDER — NARATRIPTAN HCL 2.5 MG PO TABS: 2.5000 mg | ORAL_TABLET | ORAL | Status: DC | PRN

## 2015-07-18 MED ORDER — KETOROLAC TROMETHAMINE 30 MG/ML IJ SOLN
30.0000 mg | Freq: Once | INTRAMUSCULAR | Status: AC
  Administered 2015-07-18: 30 mg via INTRAVENOUS

## 2015-07-18 MED ORDER — HYDROMORPHONE HCL 1 MG/ML IJ SOLN: 0.2500 mg | INTRAMUSCULAR | Status: DC | PRN

## 2015-07-18 MED ORDER — SUGAMMADEX SODIUM 200 MG/2ML IV SOLN
INTRAVENOUS | Status: DC | PRN
  Administered 2015-07-18: 200 mg via INTRAVENOUS

## 2015-07-18 MED ORDER — PANTOPRAZOLE SODIUM 40 MG IV SOLR
40.0000 mg | Freq: Every day | INTRAVENOUS | Status: DC
  Administered 2015-07-18: 40 mg via INTRAVENOUS
  Filled 2015-07-18: qty 40

## 2015-07-18 SURGICAL SUPPLY — 51 items
APPLIER CLIP ROT 10 11.4 M/L (STAPLE)
BLADE SURG ROTATE 9660 (MISCELLANEOUS) ×3 IMPLANT
CANISTER SUCTION 2500CC (MISCELLANEOUS) ×3 IMPLANT
CHLORAPREP W/TINT 26ML (MISCELLANEOUS) ×3 IMPLANT
CLIP APPLIE ROT 10 11.4 M/L (STAPLE) IMPLANT
CLOSURE WOUND 1/2 X4 (GAUZE/BANDAGES/DRESSINGS) ×1
COVER SURGICAL LIGHT HANDLE (MISCELLANEOUS) ×3 IMPLANT
CUTTER FLEX LINEAR 45M (STAPLE) ×3 IMPLANT
CUTTER LINEAR ENDO 35 ETS (STAPLE) IMPLANT
DERMABOND ADVANCED (GAUZE/BANDAGES/DRESSINGS) ×2
DERMABOND ADVANCED .7 DNX12 (GAUZE/BANDAGES/DRESSINGS) ×1 IMPLANT
DRSG TEGADERM 2-3/8X2-3/4 SM (GAUZE/BANDAGES/DRESSINGS) ×9 IMPLANT
ELECT REM PT RETURN 9FT ADLT (ELECTROSURGICAL) ×3
ELECTRODE REM PT RTRN 9FT ADLT (ELECTROSURGICAL) ×1 IMPLANT
ENDOLOOP SUT PDS II  0 18 (SUTURE)
ENDOLOOP SUT PDS II 0 18 (SUTURE) IMPLANT
GAUZE SPONGE 4X4 12PLY STRL (GAUZE/BANDAGES/DRESSINGS) ×3 IMPLANT
GLOVE BIO SURGEON STRL SZ8 (GLOVE) ×3 IMPLANT
GLOVE BIOGEL PI IND STRL 7.0 (GLOVE) ×3 IMPLANT
GLOVE BIOGEL PI IND STRL 8 (GLOVE) ×1 IMPLANT
GLOVE BIOGEL PI IND STRL 8.5 (GLOVE) ×1 IMPLANT
GLOVE BIOGEL PI INDICATOR 7.0 (GLOVE) ×6
GLOVE BIOGEL PI INDICATOR 8 (GLOVE) ×2
GLOVE BIOGEL PI INDICATOR 8.5 (GLOVE) ×2
GLOVE ECLIPSE 7.5 STRL STRAW (GLOVE) ×3 IMPLANT
GLOVE SURG SS PI 6.5 STRL IVOR (GLOVE) ×3 IMPLANT
GOWN STRL REUS W/ TWL LRG LVL3 (GOWN DISPOSABLE) ×2 IMPLANT
GOWN STRL REUS W/TWL LRG LVL3 (GOWN DISPOSABLE) ×4
GOWN STRL REUS W/TWL XL LVL3 (GOWN DISPOSABLE) ×3 IMPLANT
KIT BASIN OR (CUSTOM PROCEDURE TRAY) ×3 IMPLANT
KIT ROOM TURNOVER OR (KITS) ×3 IMPLANT
NS IRRIG 1000ML POUR BTL (IV SOLUTION) ×3 IMPLANT
PAD ARMBOARD 7.5X6 YLW CONV (MISCELLANEOUS) ×6 IMPLANT
PENCIL BUTTON HOLSTER BLD 10FT (ELECTRODE) IMPLANT
POUCH SPECIMEN RETRIEVAL 10MM (ENDOMECHANICALS) ×3 IMPLANT
RELOAD /EVU35 (ENDOMECHANICALS) IMPLANT
RELOAD CUTTER ETS 35MM STAND (ENDOMECHANICALS) IMPLANT
RELOAD STAPLE TA45 3.5 REG BLU (ENDOMECHANICALS) ×3 IMPLANT
SCALPEL HARMONIC ACE (MISCELLANEOUS) ×3 IMPLANT
SET IRRIG TUBING LAPAROSCOPIC (IRRIGATION / IRRIGATOR) ×3 IMPLANT
SLEEVE ENDOPATH XCEL 5M (ENDOMECHANICALS) ×3 IMPLANT
SPECIMEN JAR SMALL (MISCELLANEOUS) ×3 IMPLANT
STRIP CLOSURE SKIN 1/2X4 (GAUZE/BANDAGES/DRESSINGS) ×2 IMPLANT
SUT MNCRL AB 4-0 PS2 18 (SUTURE) ×3 IMPLANT
TOWEL OR 17X24 6PK STRL BLUE (TOWEL DISPOSABLE) IMPLANT
TOWEL OR 17X26 10 PK STRL BLUE (TOWEL DISPOSABLE) ×3 IMPLANT
TRAY FOLEY CATH 16FR SILVER (SET/KITS/TRAYS/PACK) ×3 IMPLANT
TRAY LAPAROSCOPIC MC (CUSTOM PROCEDURE TRAY) ×3 IMPLANT
TROCAR XCEL BLUNT TIP 100MML (ENDOMECHANICALS) ×3 IMPLANT
TROCAR XCEL NON-BLD 5MMX100MML (ENDOMECHANICALS) ×3 IMPLANT
TUBING INSUFFLATION (TUBING) ×3 IMPLANT

## 2015-07-18 NOTE — ED Notes (Signed)
Dr Wyatt at bedside.  

## 2015-07-18 NOTE — Op Note (Signed)
OPERATIVE REPORT  DATE OF OPERATION:  07/18/2015  PATIENT:  Jasmine [name]  50 y.o. female  PRE-OPERATIVE DIAGNOSIS:  ACUTE APPENDICITIS  POST-OPERATIVE DIAGNOSIS:  ACUTE APPENDICITIS  PROCEDURE:  Procedure(s): APPENDECTOMY LAPAROSCOPIC  FINDINGS:  Markedly enlarged appendix in the right retrocecal position extending proximally without evidence of rupture but with the possibility of a mucocele or an intraluminal tumor.  SURGEON:  Surgeon(s): Judeth Horn, MD  ASSISTANT: None  ANESTHESIA:   general  EBL: Less than 20 ml  BLOOD ADMINISTERED: none  DRAINS: none   SPECIMEN:  Source of Specimen:  The appendix  COUNTS CORRECT:  YES  PROCEDURE DETAILS: The patient was taken to the operating room and placed on table in supine position. After an adequate general endotracheal anesthetic was administered she was prepped and draped in the usual sterile manner exposing her abdomen.  A proper timeout was performed identifying the patient and procedure to be performed. A supraumbilical midline incision was made using a #15 blade and taken down to the midline fascia. We incised the fascia using #15 blade then bluntly dissected down to the peritoneal cavity. A pursestring suture of 0 Vicryl was passed around the fascial opening which secured in place the [facility] cannula which was used to insufflate carbon dioxide gas up to a maximal intra-abdominal pressure of 15 mmHg.  A right upper quadrant 5 mm cannula and a left lower quadrant 5 mm cannula were passed under direct vision. Once all cannulas were in place we placed the patient in Trendelenburg position and the left side was tilted down.  It was noted that the patient's appendix started inferiorly and worked its way proximally in the retrocecal position. We are able to roll the cecum medially and identified the base of the appendix at the cecum. We dissected out and using the Harmonic scapel took the mesial appendix using the harmonic scalpel.  Bleeding was well controlled. Once the base of the appendix was skeletonized of its blood supply we used an endoscopic GIA blue cartridge stapler to come across the base of the appendix.  This completely detached the appendix from the cecum. We used an Endo Catch bag to remove the appendix from the supraumbilical site. In order to do this I did have to enlarge the fascial incision slightly to accommodate the size of the appendix.  We subsequently reinserted the [facility] cannula and inspected the right lower quadrant area for bleeding and drainage. The staple site appeared to be fine. Those no active bleeding. We irrigated with approximately 900 mL of saline solution and aspirated all fluid and gas, place the patient back in a neutral position and then closed.  The fascia at the supraumbilical site was closed using the pursestring suture which was in place. We aspirated all fluid and gas from above the liver and removed all cannulas.  0.25% Marcaine with epinephrine was injected at all incision sites. The skin at the supraumbilical site was closed using a running subcuticular stitch of 4-0 Monocryl. Dermabond, Steri-Strips, and Tegaderms views complete our dressings. All needle counts, sponge counts, and instrument counts were correct.  PATIENT DISPOSITION:  PACU - hemodynamically stable.   Treyce Spillers 3/30/20179:09 AM

## 2015-07-18 NOTE — ED Notes (Signed)
Surgeon at bedside at this time. 

## 2015-07-18 NOTE — Anesthesia Postprocedure Evaluation (Signed)
Anesthesia Post Note  Patient: Jasmine [name]  Procedure(s) Performed: Procedure(s) (LRB): APPENDECTOMY LAPAROSCOPIC (N/A)  Patient location during evaluation: PACU Anesthesia Type: General Level of consciousness: awake and alert and oriented Pain management: pain level controlled Vital Signs Assessment: post-procedure vital signs reviewed and stable Respiratory status: spontaneous breathing, nonlabored ventilation and respiratory function stable Cardiovascular status: blood pressure returned to baseline and stable Postop Assessment: no signs of nausea or vomiting Anesthetic complications: no    Last Vitals:  Filed Vitals:   07/18/15 1045 07/18/15 1115  BP: 112/69 106/69  Pulse: 95 91  Temp:    Resp: 19 19    Last Pain:  Filed Vitals:   07/18/15 1122  PainSc: 3                  Jakeisha Stricker A.

## 2015-07-18 NOTE — ED Provider Notes (Signed)
CSN: [id number]     Arrival date & time 07/17/15  2125 History  By signing my name below, I, Rohini Rajnarayanan, attest that this documentation has been prepared under the direction and in the presence of Quintella Reichert, MD Electronically Signed: Evonnie [name], ED Scribe 07/18/2015 at 2:52 AM.   Chief Complaint  Patient presents with  . Abdominal Pain   The history is provided by the patient. No language interpreter was used.    HPI Comments: Jasmine [name] is a 50 y.o. female with a pmhx of hyperthyroidism and GERD  who presents to the Emergency Department complaining of generalized, worsening, bloated, cramping, R>L abdominal pain onset yesterday around 9:30PM. Pt relates the pain to constipation. Pt felt constipated and tried taking Mirilax 4 days ago and had 3 BM at that time. Pt denies any n/v/d, fever, vaginal discharge, or dysuria. Sxs are moderate, constant, worsening.    Past Medical History  Diagnosis Date  . Hyperthyroidism     thyrodectomy 2006  . Blood transfusion     2 units transfuesd 2000  . Headache(784.0)     migraines - hormonal - last one 6/12 -OTC  . Acid reflux   . Helicobacter pylori ab+   . Thyroid cancer ([place]) 2006   Past Surgical History  Procedure Laterality Date  . Breast lumpectomy Left 2009  . Myomectomy  2002  . Dilation and curettage, diagnostic / therapeutic  2002  . Abdominal hysterectomy  10/27/2010    Procedure: HYSTERECTOMY ABDOMINAL;  Surgeon: Marylynn Pearson;  Location: [facility];  Service: Gynecology;  Laterality: N/A;  . Thyroidectomy     Family History  Problem Relation Age of Onset  . Colon cancer Neg Hx   . CAD Paternal Uncle   . CAD Cousin   . Hypertension Mother   . Rheum arthritis Mother   . Osteoporosis Mother   . Hypertension Sister   . Hypothyroidism Sister    Social History  Substance Use Topics  . Smoking status: Never Smoker   . Smokeless tobacco: Never Used  . Alcohol Use: No   OB History    No data available      Review of Systems  Constitutional: Negative for fever.  Gastrointestinal: Positive for abdominal pain and constipation. Negative for nausea, vomiting and diarrhea.  Genitourinary: Negative for dysuria and vaginal discharge.  All other systems reviewed and are negative.  Allergies  Review of patient's allergies indicates no known allergies.  Home Medications   Prior to Admission medications   Medication Sig Start Date End Date Taking? Authorizing Provider  amitriptyline (ELAVIL) 10 MG tablet Take 40 mg by mouth at bedtime.    Yes Historical Provider, MD  chlorzoxazone (PARAFON) 500 MG tablet Take 500 mg by mouth 4 (four) times daily as needed for muscle spasms.    Yes Historical Provider, MD  ibuprofen (ADVIL,MOTRIN) 600 MG tablet Take 600 mg by mouth every 6 (six) hours as needed for moderate pain.    Yes Historical Provider, MD  levothyroxine (SYNTHROID, LEVOTHROID) 125 MCG tablet Take 125 mcg by mouth daily before breakfast.   Yes Historical Provider, MD  Multiple Vitamin (MULTIVITAMIN WITH MINERALS) TABS tablet Take 1 tablet by mouth daily with lunch.   Yes Historical Provider, MD  naratriptan (AMERGE) 2.5 MG tablet Take 2.5 mg by mouth See admin instructions. Take one (1) tablet at onset of headache; if returns or does not resolve, may repeat after 4 hours; do not exceed five (5) mg in 24 hours.  Yes Historical Provider, MD  polyethylene glycol (MIRALAX / GLYCOLAX) packet Take 17 g by mouth daily as needed for mild constipation.    Yes Historical Provider, MD  Psyllium (METAMUCIL PO) Take 5 mLs by mouth daily as needed (constipation).    Yes Historical Provider, MD   BP 149/85 mmHg  Pulse 90  Temp(Src) 98.7 F (37.1 C) (Oral)  Ht 5\' 4"  (1.626 m)  Wt 137 lb (62.143 kg)  BMI 23.50 kg/m2  SpO2 98%  LMP 09/30/2010 Physical Exam  Constitutional: She is oriented to person, place, and time. She appears well-developed and well-nourished.  HENT:  Head: Normocephalic and  atraumatic.  Cardiovascular: Normal rate and regular rhythm.   No murmur heard. Pulmonary/Chest: Effort normal and breath sounds normal. No respiratory distress.  Abdominal: Soft. There is tenderness. There is no rebound and no guarding.  Mild diffuse abdominal tenderness with moderate RLQ TTP.  Musculoskeletal: She exhibits no edema or tenderness.  Neurological: She is alert and oriented to person, place, and time.  Skin: Skin is warm and dry.  Psychiatric: She has a normal mood and affect. Her behavior is normal.  Nursing note and vitals reviewed.   ED Course  Procedures  DIAGNOSTIC STUDIES: Oxygen Saturation is 98% on RA, normal by my interpretation.    COORDINATION OF CARE: 12:27 AM-Discussed treatment plan which includes UA, blood work, ED medications, and CT A/P with pt at bedside and pt agreed to plan.   Labs Review Labs Reviewed  COMPREHENSIVE METABOLIC PANEL - Abnormal; Notable for the following:    Chloride 97 (*)    Glucose, Bld 132 (*)    Calcium 8.8 (*)    All other components within normal limits  CBC - Abnormal; Notable for the following:    WBC 16.6 (*)    All other components within normal limits  URINALYSIS, ROUTINE W REFLEX MICROSCOPIC (NOT AT [facility]) - Abnormal; Notable for the following:    Ketones, ur 15 (*)    All other components within normal limits  LIPASE, BLOOD    Imaging Review Ct Abdomen Pelvis W Contrast  07/18/2015  CLINICAL DATA:  Acute onset of worsening right-sided abdominal pain. Abdominal bloating and cramping. Initial encounter. EXAM: CT ABDOMEN AND PELVIS WITH CONTRAST TECHNIQUE: Multidetector CT imaging of the abdomen and pelvis was performed using the standard protocol following bolus administration of intravenous contrast. CONTRAST:  13mL OMNIPAQUE IOHEXOL 300 MG/ML  SOLN COMPARISON:  Pelvic ultrasound performed 11/11/2007 FINDINGS: A calcified granuloma is noted at the left lung base. A few small hypodensities within the liver are  nonspecific but may reflect small cysts. The spleen is unremarkable in appearance. The gallbladder is within normal limits. The pancreas and adrenal glands are unremarkable. There is severe relatively chronic appearing right-sided hydronephrosis, with diffuse distention of the right ureter. This may reflect the underlying appendiceal process, though a distal stricture cannot be entirely excluded. Would correlate as to whether hydronephrosis persists following appendectomy. Scattered left renal cysts are noted. No renal or ureteral stones are identified. No significant perinephric stranding is seen. The small bowel is unremarkable in appearance. The stomach is within normal limits. No acute vascular abnormalities are seen. The appendix is dilated to 2.3 cm in diameter, with diffuse surrounding soft tissue inflammation and marked wall thickening. Multiple diverticula are noted arising from the appendix, measuring up to 2.4 cm in size. The appendix is located medial to the cecum and ascending colon. There is no definite evidence of perforation at this time. Surrounding  phlegmon and free fluid are seen. The colon is partially filled with stool and is grossly unremarkable in appearance. A small amount of free fluid within the pelvis may reflect the acute appendicitis. The bladder is mildly distended and grossly unremarkable. The patient is status post hysterectomy. No suspicious adnexal masses are seen. The ovaries are relatively symmetric. No inguinal lymphadenopathy is seen. No acute osseous abnormalities are identified. IMPRESSION: 1. Acute appendicitis, with dilatation of the appendix to 2.3 cm in diameter, diffuse surrounding soft tissue inflammation and marked wall thickening. 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 The appendix is located medial to the cecum and ascending colon. No definite evidence of perforation at this time, though surrounding phlegmon and free  fluid are seen. 2. Small amount of free fluid within the pelvis may reflect the acute appendicitis. 3. Severe relatively chronic appearing right-sided hydronephrosis, with diffuse distention of the right ureter. This may reflect the underlying appendicitis, though a distal stricture cannot be entirely excluded. Would correlate as to whether right-sided hydronephrosis persists following appendectomy, and treat as deemed clinically appropriate. 4. Scattered left renal cysts noted. 5. Few small hypodensities within the liver are nonspecific but may reflect small cysts. These results were called by telephone at the time of interpretation on 07/18/2015 at 2:48 am to Dr. Quintella Reichert, who verbally acknowledged these results. Electronically Signed   By: Garald Balding M.D.   On: 07/18/2015 02:49   I have personally reviewed and evaluated these images and lab results as part of my medical decision-making.   EKG Interpretation None      MDM   Final diagnoses:  Acute appendicitis, unspecified acute appendicitis type   Patient here for evaluation of abdominal pain. Patient with significant tenderness on examination. CT scan concerning for acute appendicitis. Discussed with Dr. Georgette Dover with surgery who will evaluate the patient.  Antibiotics started in the ED. Pt updated of finding of study and need for admission for further treatment.  I personally performed the services described in this documentation, which was scribed in my presence. The recorded information has been reviewed and is accurate.     Quintella Reichert, MD 07/18/15 [phone]

## 2015-07-18 NOTE — Anesthesia Preprocedure Evaluation (Signed)
Anesthesia Evaluation  Patient identified by MRN, date of birth, ID band Patient awake    Reviewed: Allergy & Precautions, H&P , NPO status , Patient's Chart, lab work & pertinent test results  History of Anesthesia Complications Negative for: history of anesthetic complications  Airway Mallampati: II  TM Distance: >3 FB Neck ROM: full    Dental no notable dental hx.    Pulmonary neg pulmonary ROS,    Pulmonary exam normal breath sounds clear to auscultation       Cardiovascular negative cardio ROS Normal cardiovascular exam Rhythm:regular Rate:Normal     Neuro/Psych negative neurological ROS     GI/Hepatic Neg liver ROS, GERD  ,  Endo/Other  negative endocrine ROSHyperthyroidism   Renal/GU negative Renal ROS     Musculoskeletal   Abdominal   Peds  Hematology negative hematology ROS (+)   Anesthesia Other Findings   Reproductive/Obstetrics negative OB ROS                             Anesthesia Physical Anesthesia Plan  ASA: II and emergent  Anesthesia Plan: General   Post-op Pain Management:    Induction: Intravenous, Rapid sequence and Cricoid pressure planned  Airway Management Planned: Oral ETT  Additional Equipment:   Intra-op Plan:   Post-operative Plan: Extubation in OR  Informed Consent: I have reviewed the patients History and Physical, chart, labs and discussed the procedure including the risks, benefits and alternatives for the proposed anesthesia with the patient or authorized representative who has indicated his/her understanding and acceptance.   Dental Advisory Given  Plan Discussed with: Anesthesiologist, CRNA and Surgeon  Anesthesia Plan Comments:         Anesthesia Quick Evaluation

## 2015-07-18 NOTE — Transfer of Care (Signed)
Immediate Anesthesia Transfer of Care Note  Patient: Jasmine [name]  Procedure(s) Performed: Procedure(s): APPENDECTOMY LAPAROSCOPIC (N/A)  Patient Location: PACU  Anesthesia Type:General  Level of Consciousness: awake  Airway & Oxygen Therapy: Patient Spontanous Breathing and Patient connected to nasal cannula oxygen  Post-op Assessment: Report given to RN, Post -op Vital signs reviewed and stable and Patient moving all extremities X 4  Post vital signs: Reviewed and stable  Last Vitals:  Filed Vitals:   07/18/15 0713 07/18/15 0915  BP: 103/67   Pulse: 103   Temp:  36.3 C  Resp: 24     Complications: No apparent anesthesia complications

## 2015-07-18 NOTE — ED Notes (Signed)
Patient ambulated to restroom with assistance from spouse.

## 2015-07-18 NOTE — H&P (Signed)
Jasmine [name] is an 50 y.o. female.   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  The pain has localized to her RLQ and has become quite severe.  She worked today, but had a poor appetite and her abdomen became more tender and distended.  She has had some mild nausea.  She came to the ED for evaluation.  She denies any vomiting, diarrhea, fever, or urinary symptoms.  Past Medical History  Diagnosis Date  . Hyperthyroidism     thyrodectomy 2006  . Blood transfusion     2 units transfuesd 2000  . Headache(784.0)     migraines - hormonal - last one 6/12 -OTC  . Acid reflux   . Helicobacter pylori ab+   . Thyroid cancer ([place]) 2006    Past Surgical History  Procedure Laterality Date  . Breast lumpectomy Left 2009  . Myomectomy  2002  . Dilation and curettage, diagnostic / therapeutic  2002  . Abdominal hysterectomy  10/27/2010    Procedure: HYSTERECTOMY ABDOMINAL;  Surgeon: Marylynn Pearson;  Location: [facility];  Service: Gynecology;  Laterality: N/A;  . Thyroidectomy      Family History  Problem Relation Age of Onset  . Colon cancer Neg Hx   . CAD Paternal Uncle   . CAD Cousin   . Hypertension Mother   . Rheum arthritis Mother   . Osteoporosis Mother   . Hypertension Sister   . Hypothyroidism Sister    Social History:  reports that she has never smoked. She has never used smokeless tobacco. She reports that she does not drink alcohol or use illicit drugs.  Allergies: No Known Allergies  Prior to Admission medications   Medication Sig Start Date End Date Taking? Authorizing Provider  amitriptyline (ELAVIL) 10 MG tablet Take 40 mg by mouth at bedtime.    Yes Historical Provider, MD  chlorzoxazone (PARAFON) 500 MG tablet Take 500 mg by mouth 4 (four) times daily as needed for muscle spasms.    Yes Historical Provider, MD  ibuprofen  (ADVIL,MOTRIN) 600 MG tablet Take 600 mg by mouth every 6 (six) hours as needed for moderate pain.    Yes Historical Provider, MD  levothyroxine (SYNTHROID, LEVOTHROID) 125 MCG tablet Take 125 mcg by mouth daily before breakfast.   Yes Historical Provider, MD  Multiple Vitamin (MULTIVITAMIN WITH MINERALS) TABS tablet Take 1 tablet by mouth daily with lunch.   Yes Historical Provider, MD  naratriptan (AMERGE) 2.5 MG tablet Take 2.5 mg by mouth See admin instructions. Take one (1) tablet at onset of headache; if returns or does not resolve, may repeat after 4 hours; do not exceed five (5) mg in 24 hours.   Yes Historical Provider, MD  polyethylene glycol (MIRALAX / GLYCOLAX) packet Take 17 g by mouth daily as needed for mild constipation.    Yes Historical Provider, MD  Psyllium (METAMUCIL PO) Take 5 mLs by mouth daily as needed (constipation).    Yes Historical Provider, MD     Results for orders placed or performed during the hospital encounter of 07/17/15 (from the past 48 hour(s))  Urinalysis, Routine w reflex microscopic (not at [facility])     Status: Abnormal   Collection Time: 07/17/15  9:44 PM  Result Value Ref Range   Color, Urine YELLOW YELLOW   APPearance CLEAR CLEAR   Specific Gravity,  Urine 1.013 1.005 - 1.030   pH 6.5 5.0 - 8.0   Glucose, UA NEGATIVE NEGATIVE mg/dL   Hgb urine dipstick NEGATIVE NEGATIVE   Bilirubin Urine NEGATIVE NEGATIVE   Ketones, ur 15 (A) NEGATIVE mg/dL   Protein, ur NEGATIVE NEGATIVE mg/dL   Nitrite NEGATIVE NEGATIVE   Leukocytes, UA NEGATIVE NEGATIVE    Comment: MICROSCOPIC NOT DONE ON URINES WITH NEGATIVE PROTEIN, BLOOD, LEUKOCYTES, NITRITE, OR GLUCOSE <1000 mg/dL.  Lipase, blood     Status: None   Collection Time: 07/17/15  9:45 PM  Result Value Ref Range   Lipase 33 11 - 51 U/L  Comprehensive metabolic panel     Status: Abnormal   Collection Time: 07/17/15  9:45 PM  Result Value Ref Range   Sodium 136 135 - 145 mmol/L   Potassium 3.7 3.5 - 5.1 mmol/L    Chloride 97 (L) 101 - 111 mmol/L   CO2 28 22 - 32 mmol/L   Glucose, Bld 132 (H) 65 - 99 mg/dL   BUN 7 6 - 20 mg/dL   Creatinine, Ser 0.78 0.44 - 1.00 mg/dL   Calcium 8.8 (L) 8.9 - 10.3 mg/dL   Total Protein 7.5 6.5 - 8.1 g/dL   Albumin 3.9 3.5 - 5.0 g/dL   AST 22 15 - 41 U/L   ALT 17 14 - 54 U/L   Alkaline Phosphatase 41 38 - 126 U/L   Total Bilirubin 1.0 0.3 - 1.2 mg/dL   GFR calc non Af Amer >60 >60 mL/min   GFR calc Af Amer >60 >60 mL/min    Comment: (NOTE) The eGFR has been calculated using the CKD EPI equation. This calculation has not been validated in all clinical situations. eGFR's persistently <60 mL/min signify possible Chronic Kidney Disease.    Anion gap 11 5 - 15  CBC     Status: Abnormal   Collection Time: 07/17/15  9:45 PM  Result Value Ref Range   WBC 16.6 (H) 4.0 - 10.5 K/uL   RBC 4.78 3.87 - 5.11 MIL/uL   Hemoglobin 14.4 12.0 - 15.0 g/dL   HCT 42.7 36.0 - 46.0 %   MCV 89.3 78.0 - 100.0 fL   MCH 30.1 26.0 - 34.0 pg   MCHC 33.7 30.0 - 36.0 g/dL   RDW 12.7 11.5 - 15.5 %   Platelets 328 150 - 400 K/uL   Ct Abdomen Pelvis W Contrast  07/18/2015  CLINICAL DATA:  Acute onset of worsening right-sided abdominal pain. Abdominal bloating and cramping. Initial encounter. EXAM: CT ABDOMEN AND PELVIS WITH CONTRAST TECHNIQUE: Multidetector CT imaging of the abdomen and pelvis was performed using the standard protocol following bolus administration of intravenous contrast. CONTRAST:  169m OMNIPAQUE IOHEXOL 300 MG/ML  SOLN COMPARISON:  Pelvic ultrasound performed 11/11/2007 FINDINGS: A calcified granuloma is noted at the left lung base. A few small hypodensities within the liver are nonspecific but may reflect small cysts. The spleen is unremarkable in appearance. The gallbladder is within normal limits. The pancreas and adrenal glands are unremarkable. There is severe relatively chronic appearing right-sided hydronephrosis, with diffuse distention of the right ureter. This  may reflect the underlying appendiceal process, though a distal stricture cannot be entirely excluded. Would correlate as to whether hydronephrosis persists following appendectomy. Scattered left renal cysts are noted. No renal or ureteral stones are identified. No significant perinephric stranding is seen. The small bowel is unremarkable in appearance. The stomach is within normal limits. No acute vascular abnormalities are seen. The appendix  is dilated to 2.3 cm in diameter, with diffuse surrounding soft tissue inflammation and marked wall thickening. Multiple diverticula are noted arising from the appendix, measuring up to 2.4 cm in size. The appendix is located medial to the cecum and ascending colon. There is no definite evidence of perforation at this time. Surrounding phlegmon and free fluid are seen. The colon is partially filled with stool and is grossly unremarkable in appearance. A small amount of free fluid within the pelvis may reflect the acute appendicitis. The bladder is mildly distended and grossly unremarkable. The patient is status post hysterectomy. No suspicious adnexal masses are seen. The ovaries are relatively symmetric. No inguinal lymphadenopathy is seen. No acute osseous abnormalities are identified. IMPRESSION: 1. Acute appendicitis, with dilatation of the appendix to 2.3 cm in diameter, diffuse surrounding soft tissue inflammation and marked wall thickening. 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 The appendix is located medial to the cecum and ascending colon. No definite evidence of perforation at this time, though surrounding phlegmon and free fluid are seen. 2. Small amount of free fluid within the pelvis may reflect the acute appendicitis. 3. Severe relatively chronic appearing right-sided hydronephrosis, with diffuse distention of the right ureter. This may reflect the underlying appendicitis, though a distal stricture cannot be  entirely excluded. Would correlate as to whether right-sided hydronephrosis persists following appendectomy, and treat as deemed clinically appropriate. 4. Scattered left renal cysts noted. 5. Few small hypodensities within the liver are nonspecific but may reflect small cysts. These results were called by telephone at the time of interpretation on 07/18/2015 at 2:48 am to Dr. Quintella Reichert, who verbally acknowledged these results. Electronically Signed   By: Garald Balding M.D.   On: 07/18/2015 02:49    Review of Systems  Constitutional: Negative for weight loss.  HENT: Negative for ear discharge, ear pain, hearing loss and tinnitus.   Eyes: Negative for blurred vision, double vision, photophobia and pain.  Respiratory: Negative for cough, sputum production and shortness of breath.   Cardiovascular: Negative for chest pain.  Gastrointestinal: Positive for nausea, abdominal pain and constipation. Negative for vomiting.  Genitourinary: Negative for dysuria, urgency, frequency and flank pain.  Musculoskeletal: Negative for myalgias, back pain, joint pain, falls and neck pain.  Neurological: Negative for dizziness, tingling, sensory change, focal weakness, loss of consciousness and headaches.  Endo/Heme/Allergies: Does not bruise/bleed easily.  Psychiatric/Behavioral: Negative for depression, memory loss and substance abuse. The patient is not nervous/anxious.     Blood pressure 129/78, pulse 90, temperature 98.7 F (37.1 C), temperature source Oral, height 5' 4"  (1.626 m), weight 62.143 kg (137 lb), last menstrual period 09/30/2010, SpO2 98 %. Physical Exam  WDWN in NAD HEENT:  EOMI, sclera anicteric Neck:  No masses, no thyromegaly Lungs:  CTA bilaterally; normal respiratory effort CV:  Regular rate and rhythm; no murmurs Abd:  +bowel sounds, mildly distended; tender in RLQ; healed Pfannenstiel incision; no palpable masses Ext:  Well-perfused; no edema Skin:  Warm, dry; no sign of  jaundice  Assessment/Plan 1.  Acute appendicitis with no sign of perforation, but surrounding phlegmon 2.  Severe right hydronephrosis - asymptomatic with normal renal function; likely secondary to inflammation from appendicitis.  Should resolve after appendectomy  Admit to hospital for IV antibiotics, PRN pain meds Will proceed to OR in a few hours with Dr. Hulen Skains for laparoscopic, possible open appendectomy.  The surgical procedure has been discussed with the patient.  Potential risks, benefits,  alternative treatments, and expected outcomes have been explained.  All of the patient's questions at this time have been answered.  The likelihood of reaching the patient's treatment goal is good.  The patient understand the proposed surgical procedure and wishes to proceed.   Maia Petties., MD 07/18/2015, 3:58 AM

## 2015-07-18 NOTE — Progress Notes (Signed)
She has appendicitis clinically and radiologically.  Will take to the OR ASAP.  Kathryne Eriksson. Dahlia Bailiff, MD, [place] [phone] [facility] Surgery

## 2015-07-18 NOTE — ED Notes (Signed)
Patient transported to CT 

## 2015-07-18 NOTE — Progress Notes (Signed)
Report to L. Arlyss Queen RN who will take pt to [address]. Spouse updated w/room # by voice mail, pt aware.

## 2015-07-18 NOTE — Anesthesia Procedure Notes (Signed)
Procedure Name: Intubation Date/Time: 07/18/2015 8:03 AM Performed by: Rejeana Brock L Pre-anesthesia Checklist: Patient identified, Timeout performed, Emergency Drugs available, Suction available and Patient being monitored Patient Re-evaluated:Patient Re-evaluated prior to inductionOxygen Delivery Method: Circle system utilized Preoxygenation: Pre-oxygenation with 100% oxygen Intubation Type: IV induction Ventilation: Mask ventilation without difficulty Laryngoscope Size: Mac and 3 Grade View: Grade I Tube type: Oral Tube size: 7.0 mm Number of attempts: 1 Airway Equipment and Method: Stylet Placement Confirmation: ETT inserted through vocal cords under direct vision,  positive ETCO2 and breath sounds checked- equal and bilateral Secured at: 21 cm Tube secured with: Tape Dental Injury: Teeth and Oropharynx as per pre-operative assessment

## 2015-07-19 ENCOUNTER — Encounter (HOSPITAL_COMMUNITY): Payer: Self-pay | Admitting: General Surgery

## 2015-07-19 ENCOUNTER — Encounter: Payer: Self-pay | Admitting: General Surgery

## 2015-07-19 DIAGNOSIS — E0590 Thyrotoxicosis, unspecified without thyrotoxic crisis or storm: Secondary | ICD-10-CM | POA: Diagnosis not present

## 2015-07-19 DIAGNOSIS — Z85850 Personal history of malignant neoplasm of thyroid: Secondary | ICD-10-CM | POA: Diagnosis not present

## 2015-07-19 DIAGNOSIS — K3580 Unspecified acute appendicitis: Secondary | ICD-10-CM | POA: Diagnosis not present

## 2015-07-19 DIAGNOSIS — N1330 Unspecified hydronephrosis: Secondary | ICD-10-CM | POA: Diagnosis not present

## 2015-07-19 DIAGNOSIS — Z90710 Acquired absence of both cervix and uterus: Secondary | ICD-10-CM | POA: Diagnosis not present

## 2015-07-19 DIAGNOSIS — K219 Gastro-esophageal reflux disease without esophagitis: Secondary | ICD-10-CM | POA: Diagnosis not present

## 2015-07-19 LAB — CBC
HEMATOCRIT: 40.6 % (ref 36.0–46.0)
HEMOGLOBIN: 13.7 g/dL (ref 12.0–15.0)
MCH: 30 pg (ref 26.0–34.0)
MCHC: 33.7 g/dL (ref 30.0–36.0)
MCV: 89 fL (ref 78.0–100.0)
Platelets: 304 10*3/uL (ref 150–400)
RBC: 4.56 MIL/uL (ref 3.87–5.11)
RDW: 13 % (ref 11.5–15.5)
WBC: 21.1 10*3/uL — ABNORMAL HIGH (ref 4.0–10.5)

## 2015-07-19 LAB — BASIC METABOLIC PANEL
Anion gap: 8 (ref 5–15)
BUN: <5 mg/dL — ABNORMAL LOW (ref 6–20)
CHLORIDE: 107 mmol/L (ref 101–111)
CO2: 23 mmol/L (ref 22–32)
Calcium: 8.1 mg/dL — ABNORMAL LOW (ref 8.9–10.3)
Creatinine, Ser: 0.8 mg/dL (ref 0.44–1.00)
GFR calc non Af Amer: >60 mL/min (ref >60)
Glucose, Bld: 128 mg/dL — ABNORMAL HIGH (ref 65–99)
Potassium: 3.1 mmol/L — ABNORMAL LOW (ref 3.5–5.1)
SODIUM: 138 mmol/L (ref 135–145)

## 2015-07-19 MED ORDER — PANTOPRAZOLE SODIUM 40 MG PO TBEC: 40.0000 mg | DELAYED_RELEASE_TABLET | Freq: Every day | ORAL | Status: DC

## 2015-07-19 MED ORDER — HYDROCODONE-ACETAMINOPHEN 5-325 MG PO TABS: 1.0000 | ORAL_TABLET | ORAL | Status: DC | PRN

## 2015-07-19 MED ORDER — SACCHAROMYCES BOULARDII 250 MG PO CAPS: ORAL_CAPSULE | ORAL | Status: DC

## 2015-07-19 MED ORDER — AMOXICILLIN-POT CLAVULANATE 875-125 MG PO TABS: 1.0000 | ORAL_TABLET | Freq: Two times a day (BID) | ORAL | Status: DC

## 2015-07-19 MED ORDER — SACCHAROMYCES BOULARDII 250 MG PO CAPS
250.0000 mg | ORAL_CAPSULE | Freq: Two times a day (BID) | ORAL | Status: DC
  Administered 2015-07-19: 250 mg via ORAL
  Filled 2015-07-19: qty 1

## 2015-07-19 MED ORDER — ACETAMINOPHEN 325 MG PO TABS: 650.0000 mg | ORAL_TABLET | Freq: Four times a day (QID) | ORAL | Status: DC | PRN

## 2015-07-19 MED ORDER — ALUM & MAG HYDROXIDE-SIMETH 200-200-20 MG/5ML PO SUSP
30.0000 mL | Freq: Four times a day (QID) | ORAL | Status: DC | PRN
  Administered 2015-07-19: 30 mL via ORAL
  Filled 2015-07-19: qty 30

## 2015-07-19 MED ORDER — AMOXICILLIN-POT CLAVULANATE 875-125 MG PO TABS
1.0000 | ORAL_TABLET | Freq: Two times a day (BID) | ORAL | Status: DC
  Administered 2015-07-19: 1 via ORAL
  Filled 2015-07-19: qty 1

## 2015-07-19 MED FILL — HYDROCODON-APAP 5-325: 5-325 | 3 days supply | Qty: 40 | Fill #0

## 2015-07-19 MED FILL — AMOX-CLAV 875-125 MG TABLET: 875-125 | 5 days supply | Qty: 10 | Fill #0

## 2015-07-19 MED FILL — FLORASTOR 250 MG CAPSULE: 250 | 15 days supply | Qty: 60 | Fill #0

## 2015-07-19 NOTE — Progress Notes (Signed)
1 Day Post-Op  Subjective: She still feels bloated but did OK with breakfast.  Still a little distended.   Objective: Vital signs in last 24 hours: Temp:  [97.3 F (36.3 C)-98.3 F (36.8 C)] 98 F (36.7 C) (03/31 0549) Pulse Rate:  [78-100] 92 (03/31 0549) Resp:  [16-25] 16 (03/31 0549) BP: (88-119)/(55-74) 106/60 mmHg (03/31 0549) SpO2:  [94 %-100 %] 97 % (03/31 0549) Last BM Date: 07/14/15 PO 100 recorded  Regular diet ordered Urine 200 recorded Afebrile, VSS K+ 3.1 WBC  21.1K  Intake/Output from previous day: 03/30 0701 - 03/31 0700 In: 13104.7 [P.O.:100; I.V.:833.3; IV Piggyback:12171.4] Out: 210 [Urine:200; Blood:10] Intake/Output this shift:    General appearance: alert, cooperative and no distress Resp: clear to auscultation bilaterally GI: soft, distended, few BS.  sites OK  Lab Results:   Recent Labs  07/18/15 0925 07/19/15 0537  WBC 22.5* 21.1*  HGB 14.6 13.7  HCT 43.1 40.6  PLT 270 304    BMET  Recent Labs  07/17/15 2145 07/18/15 0925 07/19/15 0537  NA 136  --  138  K 3.7  --  3.1*  CL 97*  --  107  CO2 28  --  23  GLUCOSE 132*  --  128*  BUN 7  --  <5*  CREATININE 0.78 0.84 0.80  CALCIUM 8.8*  --  8.1*   PT/INR No results for input(s): LABPROT, INR in the last 72 hours.   Recent Labs Lab 07/17/15 2145  AST 22  ALT 17  ALKPHOS 41  BILITOT 1.0  PROT 7.5  ALBUMIN 3.9     Lipase     Component Value Date/Time   LIPASE 33 07/17/2015 2145     Studies/Results: Ct Abdomen Pelvis W Contrast  07/18/2015  CLINICAL DATA:  Acute onset of worsening right-sided abdominal pain. Abdominal bloating and cramping. Initial encounter. EXAM: CT ABDOMEN AND PELVIS WITH CONTRAST TECHNIQUE: Multidetector CT imaging of the abdomen and pelvis was performed using the standard protocol following bolus administration of intravenous contrast. CONTRAST:  120mL OMNIPAQUE IOHEXOL 300 MG/ML  SOLN COMPARISON:  Pelvic ultrasound performed 11/11/2007 FINDINGS: A  calcified granuloma is noted at the left lung base. A few small hypodensities within the liver are nonspecific but may reflect small cysts. The spleen is unremarkable in appearance. The gallbladder is within normal limits. The pancreas and adrenal glands are unremarkable. There is severe relatively chronic appearing right-sided hydronephrosis, with diffuse distention of the right ureter. This may reflect the underlying appendiceal process, though a distal stricture cannot be entirely excluded. Would correlate as to whether hydronephrosis persists following appendectomy. Scattered left renal cysts are noted. No renal or ureteral stones are identified. No significant perinephric stranding is seen. The small bowel is unremarkable in appearance. The stomach is within normal limits. No acute vascular abnormalities are seen. The appendix is dilated to 2.3 cm in diameter, with diffuse surrounding soft tissue inflammation and marked wall thickening. Multiple diverticula are noted arising from the appendix, measuring up to 2.4 cm in size. The appendix is located medial to the cecum and ascending colon. There is no definite evidence of perforation at this time. Surrounding phlegmon and free fluid are seen. The colon is partially filled with stool and is grossly unremarkable in appearance. A small amount of free fluid within the pelvis may reflect the acute appendicitis. The bladder is mildly distended and grossly unremarkable. The patient is status post hysterectomy. No suspicious adnexal masses are seen. The ovaries are relatively symmetric. No  inguinal lymphadenopathy is seen. No acute osseous abnormalities are identified. IMPRESSION: 1. Acute appendicitis, with dilatation of the appendix to 2.3 cm in diameter, diffuse surrounding soft tissue inflammation and marked wall thickening. 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 The appendix is located medial to the cecum and ascending  colon. No definite evidence of perforation at this time, though surrounding phlegmon and free fluid are seen. 2. Small amount of free fluid within the pelvis may reflect the acute appendicitis. 3. Severe relatively chronic appearing right-sided hydronephrosis, with diffuse distention of the right ureter. This may reflect the underlying appendicitis, though a distal stricture cannot be entirely excluded. Would correlate as to whether right-sided hydronephrosis persists following appendectomy, and treat as deemed clinically appropriate. 4. Scattered left renal cysts noted. 5. Few small hypodensities within the liver are nonspecific but may reflect small cysts. These results were called by telephone at the time of interpretation on 07/18/2015 at 2:48 am to Dr. Quintella Reichert, who verbally acknowledged these results. Electronically Signed   By: Garald Balding M.D.   On: 07/18/2015 02:49    Medications: . amitriptyline  40 mg Oral QHS  . cefTRIAXone (ROCEPHIN)  IV  2 g Intravenous Q24H   And  . metronidazole  500 mg Intravenous Q8H  . docusate sodium  100 mg Oral BID  . enoxaparin (LOVENOX) injection  40 mg Subcutaneous Q24H  . levothyroxine  125 mcg Oral QAC breakfast  . pantoprazole (PROTONIX) IV  40 mg Intravenous QHS  . piperacillin-tazobactam (ZOSYN)  IV  3.375 g Intravenous To PACU   . dextrose 5 % and 0.45 % NaCl with KCl 10 mEq/L 100 mL/hr at 07/18/15 1823    Assessment/Plan Acute appendicitis, S/p laparoscopic appendectomy   Hyperthyroidism Hx of Migraines Thyroidectomy for thyroid cancer Antibiotics:  Rocephin, flagyl still ordered she got one dose of Zosyn in PACU DVT:  Lovenox/SCD    Plan:  i hope to get her out later, but I want to give her more time and see how she does.  Will discuss with Dr. Alesia Morin 07/19/2015 [phone]

## 2015-07-19 NOTE — Discharge Instructions (Signed)
Laparoscopic Appendectomy, Adult, Care After °Refer to this sheet in the next few weeks. These instructions provide you with information on caring for yourself after your procedure. Your caregiver may also give you more specific instructions. Your treatment has been planned according to current medical practices, but problems sometimes occur. Call your caregiver if you have any problems or questions after your procedure. °HOME CARE INSTRUCTIONS °· Do not drive while taking narcotic pain medicines. °· Use stool softener if you become constipated from your pain medicines. °· Change your bandages (dressings) as directed. °· Keep your wounds clean and dry. You may wash the wounds gently with soap and water. Gently pat the wounds dry with a clean towel. °· Do not take baths, swim, or use hot tubs for 10 days, or as instructed by your caregiver. °· Only take over-the-counter or prescription medicines for pain, discomfort, or fever as directed by your caregiver. °· You may continue your normal diet as directed. °· Do not lift more than 10 pounds (4.5 kg) or play contact sports for 3 weeks, or as directed. °· Slowly increase your activity after surgery. °· Take deep breaths to avoid getting a lung infection (pneumonia). °SEEK MEDICAL CARE IF: °· You have redness, swelling, or increasing pain in your wounds. °· You have pus coming from your wounds. °· You have drainage from a wound that lasts longer than 1 day. °· You notice a bad smell coming from the wounds or dressing. °· Your wound edges break open after stitches (sutures) have been removed. °· You notice increasing pain in the shoulders (shoulder strap areas) or near your shoulder blades. °· You develop dizzy episodes or fainting while standing. °· You develop shortness of breath. °· You develop persistent nausea or vomiting. °· You cannot control your bowel functions or lose your appetite. °· You develop diarrhea. °SEEK IMMEDIATE MEDICAL CARE IF:  °· You have a  fever. °· You develop a rash. °· You have difficulty breathing or sharp pains in your chest. °· You develop any reaction or side effects to medicines given. °MAKE SURE YOU: °· Understand these instructions. °· Will watch your condition. °· Will get help right away if you are not doing well or get worse. °  °This information is not intended to replace advice given to you by your health care provider. Make sure you discuss any questions you have with your health care provider. °  °Document Released: 04/06/2005 Document Revised: 08/21/2014 Document Reviewed: 09/24/2014 °Elsevier Interactive Patient Education ©2016 [employer] ° °CCS ______[facility] °LAPAROSCOPIC SURGERY: POST OP INSTRUCTIONS °Always review your discharge instruction sheet given to you by the facility where your surgery was performed. °IF YOU HAVE DISABILITY OR FAMILY LEAVE FORMS, YOU MUST BRING THEM TO THE OFFICE FOR PROCESSING.   °DO NOT GIVE THEM TO YOUR DOCTOR. ° °1. A prescription for pain medication may be given to you upon discharge.  Take your pain medication as prescribed, if needed.  If narcotic pain medicine is not needed, then you may take acetaminophen (Tylenol) or ibuprofen (Advil) as needed. °2. Take your usually prescribed medications unless otherwise directed. °3. If you need a refill on your pain medication, please contact your pharmacy.  They will contact our office to request authorization. Prescriptions will not be filled after 5pm or on week-ends. °4. You should follow a light diet the first few days after arrival home, such as soup and crackers, etc.  Be sure to include lots of fluids daily. °5. Most   patients will experience some swelling and bruising in the area of the incisions.  Ice packs will help.  Swelling and bruising can take several days to resolve.  °6. It is common to experience some constipation if taking pain medication after surgery.  Increasing fluid intake and taking a stool softener (such as  Colace) will usually help or prevent this problem from occurring.  A mild laxative (Milk of Magnesia or Miralax) should be taken according to package instructions if there are no bowel movements after 48 hours. °7. Unless discharge instructions indicate otherwise, you may remove your bandages 24-48 hours after surgery, and you may shower at that time.  You may have steri-strips (small skin tapes) in place directly over the incision.  These strips should be left on the skin for 7-10 days.  If your surgeon used skin glue on the incision, you may shower in 24 hours.  The glue will flake off over the next 2-3 weeks.  Any sutures or staples will be removed at the office during your follow-up visit. °8. ACTIVITIES:  You may resume regular (light) daily activities beginning the next day--such as daily self-care, walking, climbing stairs--gradually increasing activities as tolerated.  You may have sexual intercourse when it is comfortable.  Refrain from any heavy lifting or straining until approved by your doctor. °a. You may drive when you are no longer taking prescription pain medication, you can comfortably wear a seatbelt, and you can safely maneuver your car and apply brakes. °b. RETURN TO WORK:  __________________________________________________________ °9. You should see your doctor in the office for a follow-up appointment approximately 2-3 weeks after your surgery.  Make sure that you call for this appointment within a day or two after you arrive home to insure a convenient appointment time. °10. OTHER INSTRUCTIONS: __________________________________________________________________________________________________________________________ __________________________________________________________________________________________________________________________ °WHEN TO CALL YOUR DOCTOR: °1. Fever over 101.0 °2. Inability to urinate °3. Continued bleeding from incision. °4. Increased pain, redness, or drainage from the  incision. °5. Increasing abdominal pain ° °The clinic staff is available to answer your questions during regular business hours.  Please don’t hesitate to call and ask to speak to one of the nurses for clinical concerns.  If you have a medical emergency, go to the nearest emergency room or call 911.  A surgeon from [facility] is always on call at the hospital. °[address], [address], [place], [place]  [address] ? [address] [address], [place], [place]   [address] °[phone] ? [phone] ? FAX [phone] °Web site: [email or web address] ° °

## 2015-07-19 NOTE — Progress Notes (Signed)
Pt discharged home in stable condition. Discharge instructions provided with no concerns voiced. Walked off  unit with her husband

## 2015-07-25 MED FILL — ALAVERT D-12 ALLERGY-SINUS: 5-120 | 15 days supply | Qty: 30 | Fill #0

## 2015-07-30 DIAGNOSIS — Z9049 Acquired absence of other specified parts of digestive tract: Secondary | ICD-10-CM | POA: Diagnosis not present

## 2015-07-30 DIAGNOSIS — R7303 Prediabetes: Secondary | ICD-10-CM | POA: Diagnosis not present

## 2015-07-30 DIAGNOSIS — C73 Malignant neoplasm of thyroid gland: Secondary | ICD-10-CM | POA: Diagnosis not present

## 2015-07-30 DIAGNOSIS — E785 Hyperlipidemia, unspecified: Secondary | ICD-10-CM | POA: Diagnosis not present

## 2015-07-31 DIAGNOSIS — R7303 Prediabetes: Secondary | ICD-10-CM | POA: Diagnosis not present

## 2015-07-31 DIAGNOSIS — Z9049 Acquired absence of other specified parts of digestive tract: Secondary | ICD-10-CM | POA: Diagnosis not present

## 2015-07-31 DIAGNOSIS — E785 Hyperlipidemia, unspecified: Secondary | ICD-10-CM | POA: Diagnosis not present

## 2015-07-31 DIAGNOSIS — C73 Malignant neoplasm of thyroid gland: Secondary | ICD-10-CM | POA: Diagnosis not present

## 2015-07-31 MED FILL — FLORASTOR 250 MG CAPSULE: 250 | 10 days supply | Qty: 20 | Fill #0

## 2015-07-31 MED FILL — MEDERMA CREAM: 28 days supply | Qty: 20 | Fill #0

## 2015-08-19 DIAGNOSIS — N951 Menopausal and female climacteric states: Secondary | ICD-10-CM | POA: Diagnosis not present

## 2015-08-19 DIAGNOSIS — E890 Postprocedural hypothyroidism: Secondary | ICD-10-CM | POA: Diagnosis not present

## 2015-08-19 DIAGNOSIS — C73 Malignant neoplasm of thyroid gland: Secondary | ICD-10-CM | POA: Diagnosis not present

## 2015-08-20 MED FILL — AMITRIPTYLINE HCL 10 MG TAB: 10 | 30 days supply | Qty: 120 | Fill #2

## 2015-08-23 MED FILL — FLORASTOR 250 MG CAPSULE: 250 | 10 days supply | Qty: 20 | Fill #1

## 2015-08-29 DIAGNOSIS — Z130 Encounter for screening for diseases of the blood and blood-forming organs and certain disorders involving the immune mechanism: Secondary | ICD-10-CM | POA: Diagnosis not present

## 2015-08-29 DIAGNOSIS — R1032 Left lower quadrant pain: Secondary | ICD-10-CM | POA: Diagnosis not present

## 2015-08-29 DIAGNOSIS — N939 Abnormal uterine and vaginal bleeding, unspecified: Secondary | ICD-10-CM | POA: Diagnosis not present

## 2015-08-29 DIAGNOSIS — K5900 Constipation, unspecified: Secondary | ICD-10-CM | POA: Diagnosis not present

## 2015-08-29 MED FILL — NARATRIPTAN HCL 2.5 MG TAB: 2.5 | 30 days supply | Qty: 9 | Fill #1

## 2015-08-29 MED FILL — CHLORZOXAZONE 500 MG TABLET: 500 | 20 days supply | Qty: 60 | Fill #1

## 2015-08-29 MED FILL — POLYETHYLENE GLYCOL 3350 PO: 30 days supply | Qty: 527 | Fill #1

## 2015-09-06 DIAGNOSIS — N939 Abnormal uterine and vaginal bleeding, unspecified: Secondary | ICD-10-CM | POA: Diagnosis not present

## 2015-09-06 DIAGNOSIS — N399 Disorder of urinary system, unspecified: Secondary | ICD-10-CM | POA: Diagnosis not present

## 2015-09-06 DIAGNOSIS — R102 Pelvic and perineal pain: Secondary | ICD-10-CM | POA: Diagnosis not present

## 2015-09-06 DIAGNOSIS — D582 Other hemoglobinopathies: Secondary | ICD-10-CM | POA: Diagnosis not present

## 2015-09-11 DIAGNOSIS — Z6823 Body mass index (BMI) 23.0-23.9, adult: Secondary | ICD-10-CM | POA: Diagnosis not present

## 2015-09-11 DIAGNOSIS — Z1231 Encounter for screening mammogram for malignant neoplasm of breast: Secondary | ICD-10-CM | POA: Diagnosis not present

## 2015-09-11 DIAGNOSIS — Z01419 Encounter for gynecological examination (general) (routine) without abnormal findings: Secondary | ICD-10-CM | POA: Diagnosis not present

## 2015-09-17 ENCOUNTER — Other Ambulatory Visit: Payer: Self-pay | Admitting: Obstetrics and Gynecology

## 2015-09-17 DIAGNOSIS — R928 Other abnormal and inconclusive findings on diagnostic imaging of breast: Secondary | ICD-10-CM

## 2015-09-23 ENCOUNTER — Ambulatory Visit
Admission: RE | Admit: 2015-09-23 | Discharge: 2015-09-23 | Disposition: A | Discharge status: Home / Self Care | Payer: 59 | Source: Ambulatory Visit | Attending: Obstetrics and Gynecology | Admitting: Obstetrics and Gynecology

## 2015-09-23 DIAGNOSIS — R928 Other abnormal and inconclusive findings on diagnostic imaging of breast: Secondary | ICD-10-CM

## 2015-09-23 DIAGNOSIS — R922 Inconclusive mammogram: Secondary | ICD-10-CM | POA: Diagnosis not present

## 2015-09-23 DIAGNOSIS — N6489 Other specified disorders of breast: Secondary | ICD-10-CM | POA: Diagnosis not present

## 2015-09-24 DIAGNOSIS — N939 Abnormal uterine and vaginal bleeding, unspecified: Secondary | ICD-10-CM | POA: Diagnosis not present

## 2015-09-24 DIAGNOSIS — R1031 Right lower quadrant pain: Secondary | ICD-10-CM | POA: Diagnosis not present

## 2015-09-24 DIAGNOSIS — R319 Hematuria, unspecified: Secondary | ICD-10-CM | POA: Diagnosis not present

## 2015-09-24 MED FILL — ESTRACE 0.01% CREAM: 0.1 | 90 days supply | Qty: 43 | Fill #0

## 2015-09-26 MED FILL — SYNTHROID 125 MCG TABLET: 125 | 90 days supply | Qty: 90 | Fill #2

## 2015-09-27 DIAGNOSIS — G43019 Migraine without aura, intractable, without status migrainosus: Secondary | ICD-10-CM | POA: Diagnosis not present

## 2015-09-27 DIAGNOSIS — M542 Cervicalgia: Secondary | ICD-10-CM | POA: Diagnosis not present

## 2015-09-27 DIAGNOSIS — G44219 Episodic tension-type headache, not intractable: Secondary | ICD-10-CM | POA: Diagnosis not present

## 2015-09-27 DIAGNOSIS — M791 Myalgia: Secondary | ICD-10-CM | POA: Diagnosis not present

## 2015-09-27 MED FILL — AMITRIPTYLINE HCL 10 MG TAB: 10 | 30 days supply | Qty: 120 | Fill #3

## 2015-09-27 MED FILL — ATENOLOL 25 MG TABLET: 25 | 30 days supply | Qty: 60 | Fill #0

## 2015-09-27 MED FILL — NARATRIPTAN HCL 2.5 MG TAB: 2.5 | 30 days supply | Qty: 9 | Fill #0

## 2015-09-27 MED FILL — BUTALB-ACETAMIN-CAFF 50-325: 50-325-40 | 7 days supply | Qty: 10 | Fill #0

## 2015-09-27 MED FILL — CHLORZOXAZONE 500 MG TABLET: 500 | 20 days supply | Qty: 60 | Fill #0

## 2015-10-08 MED FILL — METHOCARBAMOL 500 MG TABLET: 500 | 13 days supply | Qty: 40 | Fill #0

## 2015-10-14 ENCOUNTER — Encounter: Payer: Self-pay | Admitting: Podiatry

## 2015-10-14 ENCOUNTER — Ambulatory Visit (INDEPENDENT_AMBULATORY_CARE_PROVIDER_SITE_OTHER): Payer: 59 | Admitting: Podiatry

## 2015-10-14 ENCOUNTER — Ambulatory Visit (INDEPENDENT_AMBULATORY_CARE_PROVIDER_SITE_OTHER): Payer: 59

## 2015-10-14 DIAGNOSIS — M2010 Hallux valgus (acquired), unspecified foot: Secondary | ICD-10-CM | POA: Diagnosis not present

## 2015-10-14 DIAGNOSIS — R52 Pain, unspecified: Secondary | ICD-10-CM | POA: Diagnosis not present

## 2015-10-14 NOTE — Patient Instructions (Signed)
Bunion (Hallux Valgus) A bony bump (protrusion) on the inside of the foot, at the base of the first toe, is called a bunion (hallux valgus). A bunion causes the first toe to angle toward the other toes. SYMPTOMS   A bony bump on the inside of the foot, causing an outward turning of the first toe. It may also overlap the second toe.  Thickening of the skin (callus) over the bony bump.  Fluid buildup under the callus. Fluid may become red, tender, and swollen (inflamed) with constant irritation or pressure.  Foot pain and stiffness. CAUSES  Many causes exist, including:  Inherited from your family (genetics).  Injury (trauma) forcing the first toe into a position in which it overlaps other toes.  Bunions are also associated with wearing shoes that have a narrow toe box (pointy shoes). RISK INCREASES WITH:  Family history of foot abnormalities, especially bunions.  Arthritis.  Narrow shoes, especially high heels. PREVENTION  Wear shoes with a wide toe box.  Avoid shoes with high heels.  Wear a small pad between the big toe and second toe.  Maintain proper conditioning:  Foot and ankle flexibility.  Muscle strength and endurance. PROGNOSIS  With proper treatment, bunions can typically be cured. Occasionally, surgery is required.  RELATED COMPLICATIONS   Infection of the bunion.  Arthritis of the first toe.  Risks of surgery, including infection, bleeding, injury to nerves (numb toe), recurrent bunion, overcorrection (toe points inward), arthritis of the big toe, big toe pointing upward, and bone not healing. TREATMENT  Treatment first consists of stopping the activities that aggravate the pain, taking pain medicines, and icing to reduce inflammation and pain. Wear shoes with a wide toe box. Shoes can be modified by a shoe repair person to relieve pressure on the bunion, especially if you cannot find shoes with a wide enough toe box. You may also place a pad with the  center cut out in your shoe, to reduce pressure on the bunion. Sometimes, an arch support (orthotic) may reduce pressure on the bunion and alleviate the symptoms. Stretching and strengthening exercises for the muscles of the foot may be useful. You may choose to wear a brace or pad at night to hold the big toe away from the second toe. If non-surgical treatments are not successful, surgery may be needed. Surgery involves removing the overgrown tissue and correcting the position of the first toe, by realigning the bones. Bunion surgery is typically performed on an outpatient basis, meaning you can go home the same day as surgery. The surgery may involve cutting the mid portion of the bone of the first toe, or just cutting and repairing (reconstructing) the ligaments and soft tissues around the first toe.  MEDICATION   If pain medicine is needed, nonsteroidal anti-inflammatory medicines, such as aspirin and ibuprofen, or other minor pain relievers, such as acetaminophen, are often recommended.  Do not take pain medicine for 7 days before surgery.  Prescription pain relievers are usually only prescribed after surgery. Use only as directed and only as much as you need.  Ointments applied to the skin may be helpful. HEAT AND COLD  Cold treatment (icing) relieves pain and reduces inflammation. Cold treatment should be applied for 10 to 15 minutes every 2 to 3 hours for inflammation and pain and immediately after any activity that aggravates your symptoms. Use ice packs or an ice massage.  Heat treatment may be used prior to performing the stretching and strengthening activities prescribed by your   caregiver, physical therapist, or athletic trainer. Use a heat pack or a warm soak. SEEK MEDICAL CARE IF:   Symptoms get worse or do not improve in 2 weeks, despite treatment.  After surgery, you develop fever, increasing pain, redness, swelling, drainage of fluids, bleeding, or increasing warmth around the  surgical area.  New, unexplained symptoms develop. (Drugs used in treatment may produce side effects.)   This information is not intended to replace advice given to you by your health care provider. Make sure you discuss any questions you have with your health care provider.   Document Released: 04/06/2005 Document Revised: 06/29/2011 Document Reviewed: 07/19/2008 Elsevier Interactive Patient Education 2016 [employer].  Achilles Tendinitis With Rehab Achilles tendinitis is a disorder of the Achilles tendon. The Achilles tendon connects the large calf muscles (Gastrocnemius and Soleus) to the heel bone (calcaneus). This tendon is sometimes called the heel cord. It is important for pushing-off and standing on your toes and is important for walking, running, or jumping. Tendinitis is often caused by overuse and repetitive microtrauma. SYMPTOMS  Pain, tenderness, swelling, warmth, and redness may occur over the Achilles tendon even at rest.  Pain with pushing off, or flexing or extending the ankle.  Pain that is worsened after or during activity. CAUSES   Overuse sometimes seen with rapid increase in exercise programs or in sports requiring running and jumping.  Poor physical conditioning (strength and flexibility or endurance).  Running sports, especially training running down hills.  Inadequate warm-up before practice or play or failure to stretch before participation.  Injury to the tendon. PREVENTION   Warm up and stretch before practice or competition.  Allow time for adequate rest and recovery between practices and competition.  Keep up conditioning.  Keep up ankle and leg flexibility.  Improve or keep muscle strength and endurance.  Improve cardiovascular fitness.  Use proper technique.  Use proper equipment (shoes, skates).  To help prevent recurrence, taping, protective strapping, or an adhesive bandage may be recommended for several weeks after healing is  complete. PROGNOSIS   Recovery may take weeks to several months to heal.  Longer recovery is expected if symptoms have been prolonged.  Recovery is usually quicker if the inflammation is due to a direct blow as compared with overuse or sudden strain. RELATED COMPLICATIONS   Healing time will be prolonged if the condition is not correctly treated. The injury must be given plenty of time to heal.  Symptoms can reoccur if activity is resumed too soon.  Untreated, tendinitis may increase the risk of tendon rupture requiring additional time for recovery and possibly surgery. TREATMENT   The first treatment consists of rest anti-inflammatory medication, and ice to relieve the pain.  Stretching and strengthening exercises after resolution of pain will likely help reduce the risk of recurrence. Referral to a physical therapist or athletic trainer for further evaluation and treatment may be helpful.  A walking boot or cast may be recommended to rest the Achilles tendon. This can help break the cycle of inflammation and microtrauma.  Arch supports (orthotics) may be prescribed or recommended by your caregiver as an adjunct to therapy and rest.  Surgery to remove the inflamed tendon lining or degenerated tendon tissue is rarely necessary and has shown less than predictable results. MEDICATION   Nonsteroidal anti-inflammatory medications, such as aspirin and ibuprofen, may be used for pain and inflammation relief. Do not take within 7 days before surgery. Take these as directed by your caregiver. Contact your caregiver  immediately if any bleeding, stomach upset, or signs of allergic reaction occur. Other minor pain relievers, such as acetaminophen, may also be used.  Pain relievers may be prescribed as necessary by your caregiver. Do not take prescription pain medication for longer than 4 to 7 days. Use only as directed and only as much as you need.  Cortisone injections are rarely indicated.  Cortisone injections may weaken tendons and predispose to rupture. It is better to give the condition more time to heal than to use them. HEAT AND COLD  Cold is used to relieve pain and reduce inflammation for acute and chronic Achilles tendinitis. Cold should be applied for 10 to 15 minutes every 2 to 3 hours for inflammation and pain and immediately after any activity that aggravates your symptoms. Use ice packs or an ice massage.  Heat may be used before performing stretching and strengthening activities prescribed by your caregiver. Use a heat pack or a warm soak. SEEK MEDICAL CARE IF:  Symptoms get worse or do not improve in 2 weeks despite treatment.  New, unexplained symptoms develop. Drugs used in treatment may produce side effects. EXERCISES RANGE OF MOTION (ROM) AND STRETCHING EXERCISES - Achilles Tendinitis  These exercises may help you when beginning to rehabilitate your injury. Your symptoms may resolve with or without further involvement from your physician, physical therapist or athletic trainer. While completing these exercises, remember:   Restoring tissue flexibility helps normal motion to return to the joints. This allows healthier, less painful movement and activity.  An effective stretch should be held for at least 30 seconds.  A stretch should never be painful. You should only feel a gentle lengthening or release in the stretched tissue. STRETCH - Gastroc, Standing   Place hands on wall.  Extend right / left leg, keeping the front knee somewhat bent.  Slightly point your toes inward on your back foot.  Keeping your right / left heel on the floor and your knee straight, shift your weight toward the wall, not allowing your back to arch.  You should feel a gentle stretch in the right / left calf. Hold this position for __________ seconds. Repeat __________ times. Complete this stretch __________ times per day. STRETCH - Soleus, Standing   Place hands on  wall.  Extend right / left leg, keeping the other knee somewhat bent.  Slightly point your toes inward on your back foot.  Keep your right / left heel on the floor, bend your back knee, and slightly shift your weight over the back leg so that you feel a gentle stretch deep in your back calf.  Hold this position for __________ seconds. Repeat __________ times. Complete this stretch __________ times per day. STRETCH - Gastrocsoleus, Standing  Note: This exercise can place a lot of stress on your foot and ankle. Please complete this exercise only if specifically instructed by your caregiver.   Place the ball of your right / left foot on a step, keeping your other foot firmly on the same step.  Hold on to the wall or a rail for balance.  Slowly lift your other foot, allowing your body weight to press your heel down over the edge of the step.  You should feel a stretch in your right / left calf.  Hold this position for __________ seconds.  Repeat this exercise with a slight bend in your knee. Repeat __________ times. Complete this stretch __________ times per day.  STRENGTHENING EXERCISES - Achilles Tendinitis These exercises may help  you when beginning to rehabilitate your injury. They may resolve your symptoms with or without further involvement from your physician, physical therapist or athletic trainer. While completing these exercises, remember:   Muscles can gain both the endurance and the strength needed for everyday activities through controlled exercises.  Complete these exercises as instructed by your physician, physical therapist or athletic trainer. Progress the resistance and repetitions only as guided.  You may experience muscle soreness or fatigue, but the pain or discomfort you are trying to eliminate should never worsen during these exercises. If this pain does worsen, stop and make certain you are following the directions exactly. If the pain is still present after  adjustments, discontinue the exercise until you can discuss the trouble with your clinician. STRENGTH - Plantar-flexors   Sit with your right / left leg extended. Holding onto both ends of a rubber exercise band/tubing, loop it around the ball of your foot. Keep a slight tension in the band.  Slowly push your toes away from you, pointing them downward.  Hold this position for __________ seconds. Return slowly, controlling the tension in the band/tubing. Repeat __________ times. Complete this exercise __________ times per day.  STRENGTH - Plantar-flexors   Stand with your feet shoulder width apart. Steady yourself with a wall or table using as little support as needed.  Keeping your weight evenly spread over the width of your feet, rise up on your toes.*  Hold this position for __________ seconds. Repeat __________ times. Complete this exercise __________ times per day.  *If this is too easy, shift your weight toward your right / left leg until you feel challenged. Ultimately, you may be asked to do this exercise with your right / left foot only. STRENGTH - Plantar-flexors, Eccentric  Note: This exercise can place a lot of stress on your foot and ankle. Please complete this exercise only if specifically instructed by your caregiver.   Place the balls of your feet on a step. With your hands, use only enough support from a wall or rail to keep your balance.  Keep your knees straight and rise up on your toes.  Slowly shift your weight entirely to your right / left toes and pick up your opposite foot. Gently and with controlled movement, lower your weight through your right / left foot so that your heel drops below the level of the step. You will feel a slight stretch in the back of your calf at the end position.  Use the healthy leg to help rise up onto the balls of both feet, then lower weight only on the right / left leg again. Build up to 15 repetitions. Then progress to 3 consecutive sets  of 15 repetitions.*  After completing the above exercise, complete the same exercise with a slight knee bend (about 30 degrees). Again, build up to 15 repetitions. Then progress to 3 consecutive sets of 15 repetitions.* Perform this exercise __________ times per day.  *When you easily complete 3 sets of 15, your physician, physical therapist or athletic trainer may advise you to add resistance by wearing a backpack filled with additional weight. STRENGTH - Plantar Flexors, Seated   Sit on a chair that allows your feet to rest flat on the ground. If necessary, sit at the edge of the chair.  Keeping your toes firmly on the ground, lift your right / left heel as far as you can without increasing any discomfort in your ankle. Repeat __________ times. Complete this exercise __________ times  a day. *If instructed by your physician, physical therapist or athletic trainer, you may add ____________________ of resistance by placing a weighted object on your right / left knee.   This information is not intended to replace advice given to you by your health care provider. Make sure you discuss any questions you have with your health care provider.   Document Released: 11/05/2004 Document Revised: 04/27/2014 Document Reviewed: 07/19/2008 Elsevier Interactive Patient Education [employer].

## 2015-10-14 NOTE — Progress Notes (Signed)
   Subjective:    Patient ID: Jasmine [name]., female    DOB: 1965-08-25, 50 y.o.   MRN: [id number]  HPI  50 year old female presents the occipital concerns of bilateral bunions which have been ongoing for quite some time. She presents a discussed treatment options for the bunions. She states they hurt a lot shoes. 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 She said no recent treatment for this. Denies any recent injury or trauma. No other complaints at this time.  Review of Systems  All other systems reviewed and are negative.      Objective:   Physical Exam General: AAO x3, NAD  Dermatological: Skin is warm, dry and supple bilateral. Nails x 10 are well manicured; remaining integument appears unremarkable at this time. There are no open sores, no preulcerative lesions, no rash or signs of infection present.  Vascular: Dorsalis Pedis artery and Posterior Tibial artery pedal pulses are 2/4 bilateral with immedate capillary fill time. Pedal hair growth present. There is no pain with calf compression, swelling, warmth, erythema.   Neruologic: Grossly intact via light touch bilateral. Vibratory intact via tuning fork bilateral. Protective threshold with Semmes Wienstein monofilament intact to all pedal sites bilateral.   Musculoskeletal: moderate HAV is present. There is slight metatarsal head from irritation shoe gear. There is no pain or crepitation first MPJ range of motion there is no restriction of motion. There is no hypermobility present. There is mild discomfort on the bunion site. No other areas of tenderness bilaterally.   erythema on the medial aspect of the firstMuscular strength 5/5 in all groups tested bilateral.  Gait: Unassisted, Nonantalgic.      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  -I discussed  both conservative and surgical treatment options. This time she's had no treatment so ulcer with conservative treatment. I discussed their shoe gear modifications, padding, offloading, orthotics. In the future should symptoms continue I discussed with her surgical intervention. She'll consider her options for that pending conservative treatment.  -I'll follow with her at her discretion. I encouraged her call any questions, concerns or any change in symptoms.  Celesta Gentile, DPM

## 2015-10-15 DIAGNOSIS — M542 Cervicalgia: Secondary | ICD-10-CM | POA: Diagnosis not present

## 2015-10-19 DIAGNOSIS — M2010 Hallux valgus (acquired), unspecified foot: Secondary | ICD-10-CM | POA: Insufficient documentation

## 2015-10-25 MED FILL — NARATRIPTAN HCL 2.5 MG TAB: 2.5 | 30 days supply | Qty: 9 | Fill #1

## 2015-10-25 MED FILL — AMITRIPTYLINE HCL 10 MG TAB: 10 | 30 days supply | Qty: 120 | Fill #4

## 2015-10-25 MED FILL — ATENOLOL 25 MG TABLET: 25 | 30 days supply | Qty: 60 | Fill #1

## 2015-10-25 MED FILL — CHLORZOXAZONE 500 MG TABLET: 500 | 20 days supply | Qty: 60 | Fill #1

## 2015-11-12 DIAGNOSIS — M542 Cervicalgia: Secondary | ICD-10-CM | POA: Diagnosis not present

## 2015-11-25 MED FILL — AMITRIPTYLINE HCL 10 MG TAB: 10 | 30 days supply | Qty: 120 | Fill #5

## 2015-11-25 MED FILL — NARATRIPTAN HCL 2.5 MG TAB: 2.5 | 30 days supply | Qty: 9 | Fill #2

## 2015-11-25 MED FILL — ATENOLOL 25 MG TABLET: 25 | 30 days supply | Qty: 60 | Fill #2

## 2015-12-12 DIAGNOSIS — R1031 Right lower quadrant pain: Secondary | ICD-10-CM | POA: Diagnosis not present

## 2015-12-12 DIAGNOSIS — N926 Irregular menstruation, unspecified: Secondary | ICD-10-CM | POA: Diagnosis not present

## 2015-12-12 MED FILL — HYDROCORT-PRAMOXINE 2.5-1%: 2.5-1 | 7 days supply | Qty: 30 | Fill #0

## 2015-12-25 MED FILL — SYNTHROID 125 MCG TABLET: 125 | 90 days supply | Qty: 90 | Fill #3

## 2015-12-30 DIAGNOSIS — G4700 Insomnia, unspecified: Secondary | ICD-10-CM | POA: Diagnosis not present

## 2015-12-30 DIAGNOSIS — G44219 Episodic tension-type headache, not intractable: Secondary | ICD-10-CM | POA: Diagnosis not present

## 2015-12-30 DIAGNOSIS — M542 Cervicalgia: Secondary | ICD-10-CM | POA: Diagnosis not present

## 2015-12-30 DIAGNOSIS — G43009 Migraine without aura, not intractable, without status migrainosus: Secondary | ICD-10-CM | POA: Diagnosis not present

## 2015-12-31 MED FILL — ATENOLOL 25 MG TABLET: 25 | 90 days supply | Qty: 180 | Fill #0

## 2015-12-31 MED FILL — AMITRIPTYLINE HCL 10 MG TAB: 10 | 90 days supply | Qty: 360 | Fill #0

## 2015-12-31 MED FILL — BUTALB-ACETAMIN-CAFF 50-325: 50-325-40 | 2 days supply | Qty: 10 | Fill #0

## 2015-12-31 MED FILL — NARATRIPTAN HCL 2.5 MG TAB: 2.5 | 30 days supply | Qty: 9 | Fill #0

## 2016-01-01 DIAGNOSIS — N939 Abnormal uterine and vaginal bleeding, unspecified: Secondary | ICD-10-CM | POA: Diagnosis not present

## 2016-01-01 DIAGNOSIS — R1031 Right lower quadrant pain: Secondary | ICD-10-CM | POA: Diagnosis not present

## 2016-01-28 MED FILL — NARATRIPTAN HCL 2.5 MG TAB: 2.5 | 30 days supply | Qty: 9 | Fill #1

## 2016-02-24 DIAGNOSIS — Z0100 Encounter for examination of eyes and vision without abnormal findings: Secondary | ICD-10-CM | POA: Diagnosis not present

## 2016-02-27 MED FILL — NARATRIPTAN HCL 2.5 MG TAB: 2.5 | 30 days supply | Qty: 9 | Fill #2

## 2016-03-24 MED FILL — SYNTHROID 125 MCG TABLET: 125 | 90 days supply | Qty: 90 | Fill #0

## 2016-03-30 MED FILL — NARATRIPTAN HCL 2.5 MG TAB: 2.5 | 30 days supply | Qty: 9 | Fill #3

## 2016-03-30 MED FILL — CHLORZOXAZONE 500 MG TABLET: 500 | 20 days supply | Qty: 60 | Fill #0

## 2016-03-30 MED FILL — ATENOLOL 25 MG TABLET: 25 | 90 days supply | Qty: 180 | Fill #1

## 2016-03-30 MED FILL — AMITRIPTYLINE HCL 10 MG TAB: 10 | 90 days supply | Qty: 360 | Fill #1

## 2016-05-13 DIAGNOSIS — G44219 Episodic tension-type headache, not intractable: Secondary | ICD-10-CM | POA: Diagnosis not present

## 2016-05-13 DIAGNOSIS — G43009 Migraine without aura, not intractable, without status migrainosus: Secondary | ICD-10-CM | POA: Diagnosis not present

## 2016-05-21 MED FILL — BUTALB-ACETAMIN-CAFF 50-325: 50-325-40 | 3 days supply | Qty: 10 | Fill #0

## 2016-05-21 MED FILL — NARATRIPTAN HCL 2.5 MG TAB: 2.5 | 30 days supply | Qty: 9 | Fill #0

## 2016-05-21 MED FILL — CHLORZOXAZONE 500 MG TABLET: 500 | 20 days supply | Qty: 60 | Fill #0

## 2016-05-29 DIAGNOSIS — N950 Postmenopausal bleeding: Secondary | ICD-10-CM | POA: Diagnosis not present

## 2016-05-29 DIAGNOSIS — R1031 Right lower quadrant pain: Secondary | ICD-10-CM | POA: Diagnosis not present

## 2016-05-29 MED FILL — ESTRADIOL 0.1 MG/GM CRM: 0.1 | 30 days supply | Qty: 43 | Fill #0

## 2016-05-29 MED FILL — FLUCONAZOLE 150 MG TABLET: 150 | 1 days supply | Qty: 1 | Fill #0

## 2016-06-08 DIAGNOSIS — K5900 Constipation, unspecified: Secondary | ICD-10-CM | POA: Diagnosis not present

## 2016-06-08 DIAGNOSIS — R1031 Right lower quadrant pain: Secondary | ICD-10-CM | POA: Diagnosis not present

## 2016-06-11 DIAGNOSIS — R102 Pelvic and perineal pain: Secondary | ICD-10-CM | POA: Diagnosis not present

## 2016-06-11 DIAGNOSIS — R1031 Right lower quadrant pain: Secondary | ICD-10-CM | POA: Diagnosis not present

## 2016-06-19 MED FILL — SYNTHROID 125 MCG TABLET: 125 | 90 days supply | Qty: 90 | Fill #1

## 2016-06-26 MED FILL — NARATRIPTAN HCL 2.5 MG TAB: 2.5 | 30 days supply | Qty: 9 | Fill #1

## 2016-06-26 MED FILL — ATENOLOL 25 MG TABLET: 25 | 90 days supply | Qty: 180 | Fill #0

## 2016-06-26 MED FILL — POLYETHYLENE GLYCOL 3350 PO: 30 days supply | Qty: 527 | Fill #0

## 2016-06-26 MED FILL — AMITRIPTYLINE HCL 10 MG TAB: 10 | 90 days supply | Qty: 360 | Fill #0

## 2016-07-06 DIAGNOSIS — N83209 Unspecified ovarian cyst, unspecified side: Secondary | ICD-10-CM | POA: Diagnosis not present

## 2016-07-06 DIAGNOSIS — C73 Malignant neoplasm of thyroid gland: Secondary | ICD-10-CM | POA: Diagnosis not present

## 2016-07-06 DIAGNOSIS — R7309 Other abnormal glucose: Secondary | ICD-10-CM | POA: Diagnosis not present

## 2016-07-06 DIAGNOSIS — E785 Hyperlipidemia, unspecified: Secondary | ICD-10-CM | POA: Diagnosis not present

## 2016-07-06 DIAGNOSIS — N83202 Unspecified ovarian cyst, left side: Secondary | ICD-10-CM | POA: Diagnosis not present

## 2016-07-06 DIAGNOSIS — Z9049 Acquired absence of other specified parts of digestive tract: Secondary | ICD-10-CM | POA: Diagnosis not present

## 2016-07-10 ENCOUNTER — Telehealth: Payer: Self-pay | Admitting: *Deleted

## 2016-07-10 NOTE — Telephone Encounter (Signed)
I have called and left a message for the patient to call the office back. We are going to offer an earlier appts.

## 2016-07-13 ENCOUNTER — Telehealth: Payer: Self-pay | Admitting: *Deleted

## 2016-07-13 NOTE — Telephone Encounter (Signed)
Patient returned call and we have moved her appt up to April 2nd at 11am. Patient aware and will arive 15 minutes early

## 2016-07-20 ENCOUNTER — Ambulatory Visit (HOSPITAL_BASED_OUTPATIENT_CLINIC_OR_DEPARTMENT_OTHER): Payer: 59

## 2016-07-20 ENCOUNTER — Ambulatory Visit: Payer: 59 | Attending: Gynecologic Oncology | Admitting: Gynecologic Oncology

## 2016-07-20 ENCOUNTER — Encounter: Payer: Self-pay | Admitting: Gynecologic Oncology

## 2016-07-20 VITALS — BP 128/80 | HR 75 | Temp 98.2°F | Resp 20 | Ht 65.35 in | Wt 131.7 lb

## 2016-07-20 DIAGNOSIS — N939 Abnormal uterine and vaginal bleeding, unspecified: Secondary | ICD-10-CM | POA: Diagnosis not present

## 2016-07-20 DIAGNOSIS — Z79899 Other long term (current) drug therapy: Secondary | ICD-10-CM | POA: Insufficient documentation

## 2016-07-20 DIAGNOSIS — R971 Elevated cancer antigen 125 [CA 125]: Secondary | ICD-10-CM

## 2016-07-20 DIAGNOSIS — N898 Other specified noninflammatory disorders of vagina: Secondary | ICD-10-CM | POA: Diagnosis not present

## 2016-07-20 LAB — BUN AND CREATININE (CC13)
BUN: 8.2 mg/dL (ref 7.0–26.0)
Creatinine: 0.8 mg/dL (ref 0.6–1.1)
EGFR: 82 mL/min/{1.73_m2} — ABNORMAL LOW (ref >90)

## 2016-07-20 NOTE — Progress Notes (Signed)
Consult Note: Gyn-Onc  Consult was requested by Dr. Julien Girt for the evaluation of Jasmine [name] 51 y.o. female  CC:  Chief Complaint  Patient presents with  . Elevated CA-125    Assessment/Plan:  Ms. Jasmine [name]. is a 51 y.o.  with cyclic brown vaginal discharge and a rising CA-125. The CA-125's were collected approximate 28 days apart presumably at the time of the follicular phase of her cycle and increase in the value is noted. On ultrasound the cyst appears simple and without any concerning characteristics. On physical examination no residual cervix is identified and no nodularity within the cul-de-sac or vagina to suggest endometriosis. Review of the operative report indicates that the integrity of the uterus and cervix were removed. I've ordered an MRI of the pelvis to evaluate the pelvis specifically to identify if there may be endometriosis.  With respect evaluation of a rising CA-125 I offered the patient the option of laparoscopy with the opportunistic removal of bilateral fallopian tubes to evaluate the pelvis. I plan to speak with Dr. Julien Girt tomorrow to determine whether or not she wishes to perform the diagnostic laparoscopy. Jasmine Meuse V. will follow-up after the MRI is obtained to discuss the plan. At that visit the CA-125 will be repeated.  HPI: Ms. Jasmine [name]. is a 51 y.o.   gravida 1 para 0 status post hysterectomy in 2002 for uterine leiomyomas. She reports cyclic spotting since September 2008. The discharge is brown and associated bloating and abdominal discomfort similar to the premenstrual discomfort she experienced prior to her hysterectomy.  CA-125 on for every second 2018 65. Value was repeated on 07/06/2016 returned a value of 102. A pelvic ultrasound dated for every 22nd 2018 is notable for left ovary measuring 3.2 x 3.1 x 2.9 cm and a simple cyst the right ovary is very difficult to visualize with a 12 mm simple follicle was identified. There is no  fluid in the cul-de-sac.  There is no family history of GYN malignancy. Colonoscopy 2 years ago was within normal limits.    Review of Systems:  Constitutional  Feels well,  Cardiovascular  No chest pain, shortness of breath, or edema  Pulmonary  No cough or wheeze.  Gastro Intestinal  No nausea, vomitting, or diarrhoea. No bright red blood per rectum, no abdominal pain, change in bowel movement, or constipation.  Genito Urinary  No frequency, urgency, dysuria,Cyclic brown vaginal discharge, no change in appetite no bloating reports constipation but this is long-standing Musculo Skeletal  No myalgia, arthralgia, joint swelling or pain  Neurologic  No weakness, numbness, change in gait,  Psychology  No depression, anxiety, insomnia.    Current Meds:  Outpatient Encounter Prescriptions as of 07/20/2016  Medication Sig  . amitriptyline (ELAVIL) 10 MG tablet Take 40 mg by mouth at bedtime.   Marland Kitchen atenolol (TENORMIN) 25 MG tablet Take 50 mg by mouth at bedtime.  . chlorzoxazone (PARAFON) 500 MG tablet Take 500 mg by mouth 4 (four) times daily as needed for muscle spasms.   Marland Kitchen ibuprofen (ADVIL,MOTRIN) 600 MG tablet Take 600 mg by mouth every 6 (six) hours as needed for moderate pain.   Marland Kitchen levothyroxine (SYNTHROID, LEVOTHROID) 125 MCG tablet Take 125 mcg by mouth daily before breakfast.  . Multiple Vitamin (MULTIVITAMIN WITH MINERALS) TABS tablet Take 1 tablet by mouth daily with lunch.  . naratriptan (AMERGE) 2.5 MG tablet Take 2.5 mg by mouth See admin instructions. Take one (1) tablet at onset of headache; if returns  or does not resolve, may repeat after 4 hours; do not exceed five (5) mg in 24 hours.  . polyethylene glycol (MIRALAX / GLYCOLAX) packet Take 17 g by mouth daily as needed for mild constipation.   . Psyllium (METAMUCIL PO) Take 5 mLs by mouth daily as needed (constipation).   . [DISCONTINUED] acetaminophen (TYLENOL) 325 MG tablet Take 2 tablets (650 mg total) by mouth every 6  (six) hours as needed for mild pain, moderate pain or fever (or temp > 100).  . [DISCONTINUED] amoxicillin-clavulanate (AUGMENTIN) 875-125 MG tablet Take 1 tablet by mouth every 12 (twelve) hours.  . [DISCONTINUED] HYDROcodone-acetaminophen (NORCO/VICODIN) 5-325 MG tablet Take 1-2 tablets by mouth every 4 (four) hours as needed for moderate pain. (Patient not taking: Reported on 07/20/2016)  . [DISCONTINUED] saccharomyces boulardii (FLORASTOR) 250 MG capsule You can buy this at any drug store follow package directions.. Use for the next couple weeks.   No facility-administered encounter medications on file as of 07/20/2016.     Allergy: No Known Allergies  Social Hx:   Social History   Social History  . Marital status: Married    Spouse name: N/A  . Number of children: N/A  . Years of education: N/A   Occupational History  . Not on file.   Social History Main Topics  . Smoking status: Never Smoker  . Smokeless tobacco: Never Used  . Alcohol use No  . Drug use: No  . Sexual activity: Yes   Other Topics Concern  . 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 ABDOMINAL HYSTERECTOMY  10/27/2010   Procedure: HYSTERECTOMY ABDOMINAL;  Surgeon: Marylynn Pearson;  Location: [facility];  Service: Gynecology;  Laterality: N/A;  . APPENDECTOMY  07/18/2015  . BREAST LUMPECTOMY Left 2009  . DILATION AND CURETTAGE, DIAGNOSTIC / THERAPEUTIC  2002  . LAPAROSCOPIC APPENDECTOMY N/A 07/18/2015   Procedure: APPENDECTOMY LAPAROSCOPIC;  Surgeon: Judeth Horn, MD;  Location: [facility];  Service: General;  Laterality: N/A;  . MYOMECTOMY  2002  . THYROIDECTOMY      Past Medical Hx:  Past Medical History:  Diagnosis Date  . Acid reflux   . Blood transfusion 2000   "related to fibroids"  . Helicobacter pylori ab+   . Hyperthyroidism     thyrodectomy 2006  . Hypothyroidism   . Migraine    "q couple weeks" (07/19/2015)  . Thyroid cancer ([place]) 2006    Past Gynecological History: Gravida 1 para 0 menarche occurred at age of 31 with regular menses until hysterectomy in 2012 for leiomyoma. Reports history of oral contraceptive pill use for 3-5 years. No history of abnormal Pap test Patient's last menstrual period was 09/30/2010.  Family Hx:  Family History  Problem Relation Age of Onset  . Hypertension Mother   . Rheum arthritis Mother   . Osteoporosis Mother   . Hypertension Sister   . Hypothyroidism Sister   . CAD Paternal Uncle   . CAD Cousin   . Colon cancer Neg Hx     Vitals:  Blood pressure 128/80, pulse 75, temperature 98.2 F (36.8 C), temperature source Oral, resp. rate 20, height 5' 5.35" (1.66 m), weight 131 lb 11.2 oz (59.7 kg), last menstrual period 09/30/2010.  Physical Exam: WD in NAD Neck  Supple  NROM, without any enlargements.  Lymph Node Survey No cervical supraclavicular or inguinal adenopathy Cardiovascular  Pulse normal rate, regularity and rhythm. S1 and S2 normal.  Lungs  Clear to auscultation bilaterally,  Good air movement.  Skin  No rash/lesions/breakdown  Psychiatry  Alert and oriented appropriate mood affect speech and reasoning. Abdomen  Normoactive bowel sounds, abdomen soft, non-tender. Pfannenstiel incision without masses or hernia Back No CVA tenderness Genito Urinary  Vulva/vagina: Normal external female genitalia.  No lesions. No discharge or bleeding.  Bladder/urethra:  No lesions or masses  Vagina: Well estrogenized, no masses nodularity or lesions  Cervix: Absent.  Uterus: Absent Adnexa: No palpable masses. Rectal  Good tone, no masses no cul de sac nodularity.  Extremities  No bilateral cyanosis, clubbing or edema.   Janie Morning, MD, PhD 07/20/2016, 4:20 PM

## 2016-07-20 NOTE — Patient Instructions (Signed)
Have blood work done today. Our office will contact you to schedule the MRI once pre authorization has been obtained from your insurance company.

## 2016-07-23 MED FILL — NARATRIPTAN HCL 2.5 MG TAB: 2.5 | 30 days supply | Qty: 9 | Fill #2

## 2016-07-27 ENCOUNTER — Ambulatory Visit: Payer: 59 | Admitting: Gynecologic Oncology

## 2016-08-13 DIAGNOSIS — J069 Acute upper respiratory infection, unspecified: Secondary | ICD-10-CM | POA: Diagnosis not present

## 2016-08-13 MED FILL — BROMFED-DM COUGH SYRUP: 30-2-10 | 5 days supply | Qty: 180 | Fill #0

## 2016-08-13 MED FILL — ALL DAY ALLERGY 10 MG TAB: 10 | 100 days supply | Qty: 100 | Fill #0

## 2016-08-13 MED FILL — IPRATROPIUM 0.06% SPRAY: 0.06 | 21 days supply | Qty: 15 | Fill #0

## 2016-08-17 DIAGNOSIS — R05 Cough: Secondary | ICD-10-CM | POA: Diagnosis not present

## 2016-08-17 DIAGNOSIS — J3089 Other allergic rhinitis: Secondary | ICD-10-CM | POA: Diagnosis not present

## 2016-08-17 MED FILL — MELOXICAM 15 MG TABLET: 15 | 10 days supply | Qty: 10 | Fill #0

## 2016-08-17 MED FILL — DELSYM 30 MG/5 ML SUS: 30 | 7 days supply | Qty: 148 | Fill #0

## 2016-08-18 DIAGNOSIS — C73 Malignant neoplasm of thyroid gland: Secondary | ICD-10-CM | POA: Diagnosis not present

## 2016-08-18 DIAGNOSIS — E890 Postprocedural hypothyroidism: Secondary | ICD-10-CM | POA: Diagnosis not present

## 2016-08-21 ENCOUNTER — Telehealth: Payer: Self-pay | Admitting: *Deleted

## 2016-08-21 NOTE — Telephone Encounter (Signed)
Have given the patient the date/time for her MRI. I also moved appt from May 10th to June 7th. Patient aware

## 2016-08-24 MED FILL — POLYETHYLENE GLYCOL 3350 PO: 30 days supply | Qty: 527 | Fill #1

## 2016-08-24 MED FILL — NARATRIPTAN HCL 2.5 MG TAB: 2.5 | 30 days supply | Qty: 9 | Fill #3

## 2016-08-25 DIAGNOSIS — G4700 Insomnia, unspecified: Secondary | ICD-10-CM | POA: Diagnosis not present

## 2016-08-25 DIAGNOSIS — E890 Postprocedural hypothyroidism: Secondary | ICD-10-CM | POA: Diagnosis not present

## 2016-08-25 DIAGNOSIS — G44219 Episodic tension-type headache, not intractable: Secondary | ICD-10-CM | POA: Diagnosis not present

## 2016-08-25 DIAGNOSIS — C73 Malignant neoplasm of thyroid gland: Secondary | ICD-10-CM | POA: Diagnosis not present

## 2016-08-25 DIAGNOSIS — G43009 Migraine without aura, not intractable, without status migrainosus: Secondary | ICD-10-CM | POA: Diagnosis not present

## 2016-08-27 ENCOUNTER — Ambulatory Visit: Payer: 59 | Admitting: Gynecologic Oncology

## 2016-08-29 ENCOUNTER — Ambulatory Visit (HOSPITAL_COMMUNITY)
Admission: RE | Admit: 2016-08-29 | Discharge: 2016-08-29 | Disposition: A | Discharge status: Home / Self Care | Payer: 59 | Source: Ambulatory Visit | Attending: Gynecologic Oncology | Admitting: Gynecologic Oncology

## 2016-08-29 ENCOUNTER — Encounter (HOSPITAL_COMMUNITY): Payer: Self-pay

## 2016-08-29 DIAGNOSIS — N939 Abnormal uterine and vaginal bleeding, unspecified: Secondary | ICD-10-CM

## 2016-08-29 DIAGNOSIS — R971 Elevated cancer antigen 125 [CA 125]: Secondary | ICD-10-CM

## 2016-08-29 MED ORDER — GADOBENATE DIMEGLUMINE 529 MG/ML IV SOLN: 15.0000 mL | Freq: Once | INTRAVENOUS | Status: DC | PRN

## 2016-08-31 ENCOUNTER — Telehealth: Payer: Self-pay | Admitting: *Deleted

## 2016-08-31 DIAGNOSIS — N939 Abnormal uterine and vaginal bleeding, unspecified: Secondary | ICD-10-CM

## 2016-08-31 NOTE — Telephone Encounter (Signed)
I have returned the patient's call and gave her the telephone number to central scheduling to reschedule hr MRI. Patient stated that "they told me that I will need the wide boar machine at [facility] and also some meds."

## 2016-09-01 ENCOUNTER — Telehealth: Payer: Self-pay | Admitting: *Deleted

## 2016-09-01 NOTE — Telephone Encounter (Signed)
I have called and given the patient instructions to call [facility] for her MRI scan. I left her the phone number and asked her to call us with the appt date/time.

## 2016-09-01 NOTE — Telephone Encounter (Signed)
LM for pt stating that Joylene John, NP suggested to have MRI of pelvis at [facility] imaging as they have an open MRI Scanner and she would not need sedation and or driver. Requested a call back from patient for discuss rational for  MRI at [facility].

## 2016-09-02 NOTE — Telephone Encounter (Signed)
Pt will call [facility] to schedule appointment  And cancel the one at [facility].

## 2016-09-03 MED ORDER — LORAZEPAM 0.5 MG PO TABS: 0.5000 mg | ORAL_TABLET | Freq: Once | ORAL | 0 refills | Status: AC

## 2016-09-03 MED FILL — BUTALB-ACETAMIN-CAFF 50-325: 50-325-40 | 7 days supply | Qty: 10 | Fill #0

## 2016-09-03 MED FILL — LORazepam 0.5 MG TABS: 0.5 | 1 days supply | Qty: 1 | Fill #0

## 2016-09-03 MED FILL — CHLORZOXAZONE 500 MG TABLET: 500 | 20 days supply | Qty: 60 | Fill #0

## 2016-09-03 NOTE — Telephone Encounter (Signed)
Pt called stating that the MRI scanner at [facility] is the same one at [facility].  It is larger but not open. Joylene John, NP. Prescribed  0.5g mg of Ativan to take as directed in ED prior to scan on 09-05-16.  Prescription called into patient's pharmacy.

## 2016-09-05 ENCOUNTER — Ambulatory Visit (HOSPITAL_COMMUNITY)
Admission: RE | Admit: 2016-09-05 | Discharge: 2016-09-05 | Disposition: A | Discharge status: Home / Self Care | Payer: 59 | Source: Ambulatory Visit | Attending: Gynecologic Oncology | Admitting: Gynecologic Oncology

## 2016-09-05 ENCOUNTER — Other Ambulatory Visit: Payer: Self-pay | Admitting: Gynecologic Oncology

## 2016-09-05 DIAGNOSIS — N939 Abnormal uterine and vaginal bleeding, unspecified: Secondary | ICD-10-CM

## 2016-09-05 DIAGNOSIS — R971 Elevated cancer antigen 125 [CA 125]: Secondary | ICD-10-CM | POA: Diagnosis not present

## 2016-09-05 DIAGNOSIS — Z90710 Acquired absence of both cervix and uterus: Secondary | ICD-10-CM | POA: Insufficient documentation

## 2016-09-05 MED ORDER — GADOBENATE DIMEGLUMINE 529 MG/ML IV SOLN
13.0000 mL | Freq: Once | INTRAVENOUS | Status: AC | PRN
  Administered 2016-09-05: 13 mL via INTRAVENOUS

## 2016-09-11 ENCOUNTER — Telehealth: Payer: Self-pay | Admitting: Gynecologic Oncology

## 2016-09-11 NOTE — Telephone Encounter (Signed)
LM for Ms Betterton to call back to the gyn clinic for the results of the MRI. The MRI did not show any Endometriosis per Joylene John, NP.

## 2016-09-11 NOTE — Telephone Encounter (Signed)
MRI reviewed. RN to call patient

## 2016-09-11 NOTE — Telephone Encounter (Signed)
Told Ms Thoreson the results of the MRI as noted below.  Pt will keep 09-24-16 appointment with Dr. Skeet Latch as scheduled.

## 2016-09-18 MED FILL — SYNTHROID 125 MCG TABLET: 125 | 90 days supply | Qty: 90 | Fill #2

## 2016-09-23 MED FILL — AMITRIPTYLINE HCL 10 MG TAB: 10 | 90 days supply | Qty: 360 | Fill #1

## 2016-09-23 MED FILL — ATENOLOL 25 MG TABLET: 25 | 90 days supply | Qty: 180 | Fill #1

## 2016-09-24 ENCOUNTER — Encounter: Payer: Self-pay | Admitting: Gynecologic Oncology

## 2016-09-24 ENCOUNTER — Ambulatory Visit (HOSPITAL_BASED_OUTPATIENT_CLINIC_OR_DEPARTMENT_OTHER): Payer: 59

## 2016-09-24 ENCOUNTER — Ambulatory Visit: Payer: 59 | Attending: Gynecologic Oncology | Admitting: Gynecologic Oncology

## 2016-09-24 VITALS — BP 122/80 | HR 65 | Resp 18 | Wt 130.7 lb

## 2016-09-24 DIAGNOSIS — K625 Hemorrhage of anus and rectum: Secondary | ICD-10-CM | POA: Insufficient documentation

## 2016-09-24 DIAGNOSIS — R971 Elevated cancer antigen 125 [CA 125]: Secondary | ICD-10-CM | POA: Diagnosis not present

## 2016-09-24 DIAGNOSIS — N939 Abnormal uterine and vaginal bleeding, unspecified: Secondary | ICD-10-CM | POA: Insufficient documentation

## 2016-09-24 DIAGNOSIS — E890 Postprocedural hypothyroidism: Secondary | ICD-10-CM | POA: Insufficient documentation

## 2016-09-24 DIAGNOSIS — K219 Gastro-esophageal reflux disease without esophagitis: Secondary | ICD-10-CM | POA: Insufficient documentation

## 2016-09-24 DIAGNOSIS — E0590 Thyrotoxicosis, unspecified without thyrotoxic crisis or storm: Secondary | ICD-10-CM | POA: Diagnosis not present

## 2016-09-24 DIAGNOSIS — K644 Residual hemorrhoidal skin tags: Secondary | ICD-10-CM | POA: Diagnosis not present

## 2016-09-24 DIAGNOSIS — Z85850 Personal history of malignant neoplasm of thyroid: Secondary | ICD-10-CM | POA: Diagnosis not present

## 2016-09-24 DIAGNOSIS — Z8249 Family history of ischemic heart disease and other diseases of the circulatory system: Secondary | ICD-10-CM | POA: Insufficient documentation

## 2016-09-24 DIAGNOSIS — R102 Pelvic and perineal pain: Secondary | ICD-10-CM | POA: Insufficient documentation

## 2016-09-24 DIAGNOSIS — Z90710 Acquired absence of both cervix and uterus: Secondary | ICD-10-CM | POA: Diagnosis not present

## 2016-09-24 DIAGNOSIS — K5909 Other constipation: Secondary | ICD-10-CM

## 2016-09-24 NOTE — Progress Notes (Signed)
Consult Note: Gyn-Onc  Consult was requested by Dr. Julien Girt for the evaluation of Jasmine [name] 51 y.o. female  CC:  Chief Complaint  Patient presents with  . Vaginal Bleeding  . Elevated CA-125    Assessment/Plan:  Ms. Jasmine [name]. is a 51 y.o.   Rectal bleeding Jasmine Meuse V. presented with photos and evidence of bright red blood.  On examination today and vascular external hemmorhoid was appreciated. External genitalia and vagina inspected.  No lesions appreciated no teleangiectasia.  Previous colonoscopy 2 years ago, Follow-up with GI if bleeding persists. Advised against constipation  Cyclic pelvic pain Imaging without any suspicious changes.  Reports cyclic pelvic discomfort. Offered lsc to evaluate the pelvis with consideration for ablation of endometriosis if encountered, or referral to pelvic pain clinic.    Rising CA 125 The CA-125's were collected approximate 28 days apart presumably at the time of the follicular phase of her cycle and increase in the value is noted. On ultrasound the cyst appeared simple and without any concerning characteristics. MRI 09/05/2016 is unremarkable.   Repeat CA 125 value will be obtained today.  If elevated recommend diagnostic lsc. Follow-up in 6 months   HPI: Ms. Jasmine [name]. is a 51 y.o.   gravida 1 para 0 status post hysterectomy in 2002 for uterine leiomyomas. She reports cyclic spotting since September 2008. The discharge is brown and associated bloating and abdominal discomfort similar to the premenstrual discomfort she experienced prior to her hysterectomy.  CA-125 on for every second 2018 65. Value was repeated on 07/06/2016 returned a value of 102. A pelvic ultrasound dated for every 22nd 2018 is notable for left ovary measuring 3.2 x 3.1 x 2.9 cm and a simple cyst the right ovary is very difficult to visualize with a 12 mm simple follicle was identified. There is no fluid in the cul-de-sac.  There is no family  history of GYN malignancy. Colonoscopy 2 years ago was within normal limits.  MRI 08/2016 unremarkable.  Reports several instances of bright red blood when cleaning external genitalia.     Review of Systems:  Constitutional  Feels well,  Cardiovascular  No chest pain, shortness of breath, or edema  Pulmonary  No cough or wheeze.  Gastro Intestinal  No nausea, vomitting, or diarrhoea. No bright red blood per rectum, no abdominal pain, change in bowel movement, or constipation.  Genito Urinary  No frequency, urgency, dysuria, bleeding with wiping, no change in appetite no bloating reports constipation but this is long-standing Musculo Skeletal  No myalgia, arthralgia, joint swelling or pain  Neurologic  No weakness, numbness, change in gait,  Psychology  No depression, anxiety, insomnia.    Current Meds:  Outpatient Encounter Prescriptions as of 09/24/2016  Medication Sig  . amitriptyline (ELAVIL) 10 MG tablet Take 40 mg by mouth at bedtime.   Marland Kitchen atenolol (TENORMIN) 25 MG tablet Take 50 mg by mouth at bedtime.  . chlorzoxazone (PARAFON) 500 MG tablet Take 500 mg by mouth 4 (four) times daily as needed for muscle spasms.   Marland Kitchen ibuprofen (ADVIL,MOTRIN) 600 MG tablet Take 600 mg by mouth every 6 (six) hours as needed for moderate pain.   Marland Kitchen levothyroxine (SYNTHROID, LEVOTHROID) 125 MCG tablet Take 125 mcg by mouth daily before breakfast.  . Multiple Vitamin (MULTIVITAMIN WITH MINERALS) TABS tablet Take 1 tablet by mouth daily with lunch.  . naratriptan (AMERGE) 2.5 MG tablet Take 2.5 mg by mouth See admin instructions. Take one (1) tablet at onset  of headache; if returns or does not resolve, may repeat after 4 hours; do not exceed five (5) mg in 24 hours.  . polyethylene glycol (MIRALAX / GLYCOLAX) packet Take 17 g by mouth daily as needed for mild constipation.   . Psyllium (METAMUCIL PO) Take 5 mLs by mouth daily as needed (constipation).    No facility-administered encounter medications  on file as of 09/24/2016.     Allergy: No Known Allergies  Social Hx:   Social History   Social History  . Marital status: Married    Spouse name: N/A  . Number of children: N/A  . Years of education: N/A   Occupational History  . Not on file.   Social History Main Topics  . Smoking status: Never Smoker  . Smokeless tobacco: Never Used  . Alcohol use No  . Drug use: No  . Sexual activity: Yes   Other Topics Concern  . 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 ABDOMINAL HYSTERECTOMY  10/27/2010   Procedure: HYSTERECTOMY ABDOMINAL;  Surgeon: Marylynn Pearson;  Location: [facility];  Service: Gynecology;  Laterality: N/A;  . APPENDECTOMY  07/18/2015  . BREAST LUMPECTOMY Left 2009  . DILATION AND CURETTAGE, DIAGNOSTIC / THERAPEUTIC  2002  . LAPAROSCOPIC APPENDECTOMY N/A 07/18/2015   Procedure: APPENDECTOMY LAPAROSCOPIC;  Surgeon: Judeth Horn, MD;  Location: [place] [facility];  Service: General;  Laterality: N/A;  . MYOMECTOMY  2002  . THYROIDECTOMY      Past Medical Hx:  Past Medical History:  Diagnosis Date  . Acid reflux   . Blood transfusion 2000   "related to fibroids"  . Helicobacter pylori ab+   . Hyperthyroidism    thyrodectomy 2006  . Hypothyroidism   . Migraine    "q couple weeks" (07/19/2015)  . Thyroid cancer ([place]) 2006    Past Gynecological History: Gravida 1 para 0 menarche occurred at age of 30 with regular menses until hysterectomy in 2012 for leiomyoma. Reports history of oral contraceptive pill use for 3-5 years. No history of abnormal Pap test Patient's last menstrual period was 09/30/2010.  Family Hx:  Family History  Problem Relation Age of Onset  . Hypertension Mother   . Rheum arthritis Mother   . Osteoporosis Mother   . Hypertension Sister   . Hypothyroidism Sister   . CAD Paternal Uncle    . CAD Cousin   . Colon cancer Neg Hx     Vitals:  Blood pressure 122/80, pulse 65, resp. rate 18, weight 130 lb 11.2 oz (59.3 kg), last menstrual period 09/30/2010.  Physical Exam: WD in NAD Neck  Supple NROM, without any enlargements.  Lymph Node Survey No cervical supraclavicular or inguinal adenopathy Cardiovascular  Pulse normal rate, regularity and rhythm.  Lungs  Clear to auscultation bilaterally, Skin  No rash/lesions/breakdown  Psychiatry  Alert and oriented appropriate mood affect speech and reasoning. Abdomen  Normoactive bowel sounds, abdomen soft, non-tender. Pfannenstiel incision without masses or hernia Back No CVA tenderness Genito Urinary  Vulva/vagina: Normal external female genitalia.  No lesions. No discharge or bleeding.  Bladder/urethra:  No lesions or masses  Vagina: Well estrogenized, no masses nodularity or lesions  Cervix: Absent.  Uterus: Absent Adnexa: No palpable masses. Rectal  Good tone, no masses no cul de  sac nodularity.  External hemorrhoid, erythematous  with visible superficial vessel. Extremities  No bilateral cyanosis, clubbing or edema.   Janie Morning, MD, PhD 09/24/2016, 1:23 PM

## 2016-09-24 NOTE — Patient Instructions (Signed)
We will call you with the results of your CA 125.  Potential dates for diagnostic laparoscopy include June 21 or July 19.  Please call for any questions or concerns.

## 2016-09-25 ENCOUNTER — Telehealth: Payer: Self-pay

## 2016-09-25 LAB — CA 125: Cancer Antigen (CA) 125: 36.7 U/mL (ref 0.0–38.1)

## 2016-09-25 MED FILL — NARATRIPTAN HCL 2.5 MG TAB: 2.5 | 30 days supply | Qty: 9 | Fill #0

## 2016-09-25 NOTE — Telephone Encounter (Signed)
Told Jasmine [name] that the CA-125 marker was down to 36.7.  Dr. Skeet Latch said that no surgery is needed at this time. She will see you in 6 months 04-08-17 with a CQ-125 marker drawn on 03-30-17. Pt verbalized understanding.

## 2016-09-29 DIAGNOSIS — E785 Hyperlipidemia, unspecified: Secondary | ICD-10-CM | POA: Diagnosis not present

## 2016-09-29 DIAGNOSIS — C73 Malignant neoplasm of thyroid gland: Secondary | ICD-10-CM | POA: Diagnosis not present

## 2016-09-29 DIAGNOSIS — R7303 Prediabetes: Secondary | ICD-10-CM | POA: Diagnosis not present

## 2016-09-29 DIAGNOSIS — Z9049 Acquired absence of other specified parts of digestive tract: Secondary | ICD-10-CM | POA: Diagnosis not present

## 2016-09-29 MED FILL — HYDROCORT-PRAMOXINE 2.5-1%: 2.5-1 | 7 days supply | Qty: 30 | Fill #1

## 2016-09-29 MED FILL — predniSONE 20 MG TABS: 20 | 4 days supply | Qty: 10 | Fill #0

## 2016-10-06 DIAGNOSIS — Z9049 Acquired absence of other specified parts of digestive tract: Secondary | ICD-10-CM | POA: Diagnosis not present

## 2016-10-06 DIAGNOSIS — C73 Malignant neoplasm of thyroid gland: Secondary | ICD-10-CM | POA: Diagnosis not present

## 2016-10-06 DIAGNOSIS — E785 Hyperlipidemia, unspecified: Secondary | ICD-10-CM | POA: Diagnosis not present

## 2016-10-06 DIAGNOSIS — R7309 Other abnormal glucose: Secondary | ICD-10-CM | POA: Diagnosis not present

## 2016-10-28 MED FILL — NARATRIPTAN HCL 2.5 MG TAB: 2.5 | 30 days supply | Qty: 9 | Fill #1

## 2016-10-28 MED FILL — HYDROCORT-PRAMOXINE 2.5-1%: 2.5-1 | 7 days supply | Qty: 30 | Fill #2

## 2016-11-02 DIAGNOSIS — Z01419 Encounter for gynecological examination (general) (routine) without abnormal findings: Secondary | ICD-10-CM | POA: Diagnosis not present

## 2016-11-02 DIAGNOSIS — Z6822 Body mass index (BMI) 22.0-22.9, adult: Secondary | ICD-10-CM | POA: Diagnosis not present

## 2016-11-02 DIAGNOSIS — Z1231 Encounter for screening mammogram for malignant neoplasm of breast: Secondary | ICD-10-CM | POA: Diagnosis not present

## 2016-11-27 MED FILL — HYDROCORT-PRAMOXINE 2.5-1%: 2.5-1 | 7 days supply | Qty: 30 | Fill #3

## 2016-11-27 MED FILL — NARATRIPTAN HCL 2.5 MG TAB: 2.5 | 30 days supply | Qty: 9 | Fill #2

## 2016-12-14 DIAGNOSIS — R300 Dysuria: Secondary | ICD-10-CM | POA: Diagnosis not present

## 2016-12-14 DIAGNOSIS — N390 Urinary tract infection, site not specified: Secondary | ICD-10-CM | POA: Diagnosis not present

## 2016-12-14 DIAGNOSIS — R3129 Other microscopic hematuria: Secondary | ICD-10-CM | POA: Diagnosis not present

## 2016-12-14 MED FILL — CIPROFLOXACIN HCL 500 MG TA: 500 | 7 days supply | Qty: 14 | Fill #0

## 2016-12-18 MED FILL — CHLORZOXAZONE 500 MG TABLET: 500 | 20 days supply | Qty: 60 | Fill #1

## 2016-12-18 MED FILL — SYNTHROID 125 MCG TABLET: 125 | 90 days supply | Qty: 90 | Fill #3

## 2016-12-22 DIAGNOSIS — N3001 Acute cystitis with hematuria: Secondary | ICD-10-CM | POA: Diagnosis not present

## 2016-12-25 MED FILL — NARATRIPTAN HCL 2.5 MG TAB: 2.5 | 30 days supply | Qty: 9 | Fill #3

## 2016-12-25 MED FILL — AMITRIPTYLINE HCL 10 MG TAB: 10 | 90 days supply | Qty: 360 | Fill #0

## 2016-12-25 MED FILL — ATENOLOL 25 MG TABLET: 25 | 90 days supply | Qty: 180 | Fill #0

## 2017-01-04 DIAGNOSIS — N760 Acute vaginitis: Secondary | ICD-10-CM | POA: Diagnosis not present

## 2017-01-13 DIAGNOSIS — R1031 Right lower quadrant pain: Secondary | ICD-10-CM | POA: Diagnosis not present

## 2017-01-16 DIAGNOSIS — H6980 Other specified disorders of Eustachian tube, unspecified ear: Secondary | ICD-10-CM | POA: Diagnosis not present

## 2017-01-19 MED FILL — POLYETHYLENE GLYCOL 3350 PO: 30 days supply | Qty: 527 | Fill #0

## 2017-01-22 MED FILL — FLUCONAZOLE 150 MG TABLET: 150 | 7 days supply | Qty: 2 | Fill #0

## 2017-01-22 MED FILL — AMOXICILLIN 875 MG TABLET: 875 | 10 days supply | Qty: 20 | Fill #0

## 2017-01-22 MED FILL — NASALCROM 5.2 MG/ACT AERS: 5.2 | 30 days supply | Qty: 13 | Fill #0

## 2017-01-25 MED FILL — SF 5000 PLUS CREAM: 1.1 | 30 days supply | Qty: 51 | Fill #0

## 2017-01-26 ENCOUNTER — Telehealth: Payer: Self-pay | Admitting: *Deleted

## 2017-01-26 MED FILL — NARATRIPTAN HCL 2.5 MG TAB: 2.5 | 30 days supply | Qty: 9 | Fill #4

## 2017-01-26 NOTE — Telephone Encounter (Signed)
Patient called for her appt dates/times for December. appt dates/times given to the patient

## 2017-02-16 DIAGNOSIS — S60419A Abrasion of unspecified finger, initial encounter: Secondary | ICD-10-CM | POA: Diagnosis not present

## 2017-02-16 DIAGNOSIS — C73 Malignant neoplasm of thyroid gland: Secondary | ICD-10-CM | POA: Diagnosis not present

## 2017-02-16 DIAGNOSIS — R7303 Prediabetes: Secondary | ICD-10-CM | POA: Diagnosis not present

## 2017-02-16 DIAGNOSIS — G43909 Migraine, unspecified, not intractable, without status migrainosus: Secondary | ICD-10-CM | POA: Diagnosis not present

## 2017-02-16 DIAGNOSIS — S20212A Contusion of left front wall of thorax, initial encounter: Secondary | ICD-10-CM | POA: Diagnosis not present

## 2017-02-16 DIAGNOSIS — S60512A Abrasion of left hand, initial encounter: Secondary | ICD-10-CM | POA: Diagnosis not present

## 2017-02-16 DIAGNOSIS — Z8249 Family history of ischemic heart disease and other diseases of the circulatory system: Secondary | ICD-10-CM | POA: Diagnosis not present

## 2017-02-16 DIAGNOSIS — E785 Hyperlipidemia, unspecified: Secondary | ICD-10-CM | POA: Diagnosis not present

## 2017-02-16 DIAGNOSIS — Z23 Encounter for immunization: Secondary | ICD-10-CM | POA: Diagnosis not present

## 2017-02-16 MED FILL — HYDROCODON-APAP 5-325: 5-325 | 3 days supply | Qty: 12 | Fill #0

## 2017-03-09 MED FILL — NARATRIPTAN HCL 2.5 MG TAB: 2.5 | 30 days supply | Qty: 9 | Fill #5

## 2017-03-16 MED FILL — SYNTHROID 125 MCG TABLET: 125 | 90 days supply | Qty: 90 | Fill #4

## 2017-03-16 MED FILL — BUTALB-ACETAMIN-CAFF 50-325: 50-325-40 | 7 days supply | Qty: 10 | Fill #1

## 2017-03-23 MED FILL — CHLORZOXAZONE 500 MG TABLET: 500 | 10 days supply | Qty: 30 | Fill #0

## 2017-03-23 MED FILL — ATENOLOL 25 MG TABLET: 25 | 90 days supply | Qty: 180 | Fill #1

## 2017-03-23 MED FILL — AMITRIPTYLINE HCL 10 MG TAB: 10 | 90 days supply | Qty: 360 | Fill #1

## 2017-03-30 ENCOUNTER — Other Ambulatory Visit (HOSPITAL_BASED_OUTPATIENT_CLINIC_OR_DEPARTMENT_OTHER): Payer: 59

## 2017-03-30 ENCOUNTER — Other Ambulatory Visit: Payer: 59

## 2017-03-30 DIAGNOSIS — R971 Elevated cancer antigen 125 [CA 125]: Secondary | ICD-10-CM | POA: Diagnosis not present

## 2017-03-31 DIAGNOSIS — Z0100 Encounter for examination of eyes and vision without abnormal findings: Secondary | ICD-10-CM | POA: Diagnosis not present

## 2017-03-31 LAB — CA 125: Cancer Antigen (CA) 125: 35.7 U/mL (ref 0.0–38.1)

## 2017-04-08 ENCOUNTER — Encounter: Payer: Self-pay | Admitting: Gynecologic Oncology

## 2017-04-08 ENCOUNTER — Ambulatory Visit: Payer: 59 | Attending: Gynecologic Oncology | Admitting: Gynecologic Oncology

## 2017-04-08 VITALS — BP 117/77 | HR 73 | Temp 98.1°F | Resp 18 | Ht 64.0 in | Wt 131.8 lb

## 2017-04-08 DIAGNOSIS — R102 Pelvic and perineal pain: Secondary | ICD-10-CM | POA: Insufficient documentation

## 2017-04-08 DIAGNOSIS — Z791 Long term (current) use of non-steroidal anti-inflammatories (NSAID): Secondary | ICD-10-CM | POA: Diagnosis not present

## 2017-04-08 DIAGNOSIS — N939 Abnormal uterine and vaginal bleeding, unspecified: Secondary | ICD-10-CM | POA: Diagnosis not present

## 2017-04-08 DIAGNOSIS — K644 Residual hemorrhoidal skin tags: Secondary | ICD-10-CM | POA: Diagnosis not present

## 2017-04-08 DIAGNOSIS — K219 Gastro-esophageal reflux disease without esophagitis: Secondary | ICD-10-CM | POA: Insufficient documentation

## 2017-04-08 DIAGNOSIS — E039 Hypothyroidism, unspecified: Secondary | ICD-10-CM | POA: Diagnosis not present

## 2017-04-08 DIAGNOSIS — Z8261 Family history of arthritis: Secondary | ICD-10-CM | POA: Diagnosis not present

## 2017-04-08 DIAGNOSIS — E0590 Thyrotoxicosis, unspecified without thyrotoxic crisis or storm: Secondary | ICD-10-CM | POA: Insufficient documentation

## 2017-04-08 DIAGNOSIS — Z9889 Other specified postprocedural states: Secondary | ICD-10-CM | POA: Diagnosis not present

## 2017-04-08 DIAGNOSIS — Z90710 Acquired absence of both cervix and uterus: Secondary | ICD-10-CM | POA: Diagnosis not present

## 2017-04-08 DIAGNOSIS — Z79899 Other long term (current) drug therapy: Secondary | ICD-10-CM | POA: Diagnosis not present

## 2017-04-08 DIAGNOSIS — Z85850 Personal history of malignant neoplasm of thyroid: Secondary | ICD-10-CM | POA: Diagnosis not present

## 2017-04-08 DIAGNOSIS — R971 Elevated cancer antigen 125 [CA 125]: Secondary | ICD-10-CM | POA: Insufficient documentation

## 2017-04-08 DIAGNOSIS — Z8249 Family history of ischemic heart disease and other diseases of the circulatory system: Secondary | ICD-10-CM | POA: Insufficient documentation

## 2017-04-08 NOTE — Progress Notes (Signed)
Consult Note: Gyn-Onc  Consult was requested by Jasmine [name] for the evaluation of Jasmine [name] 51 y.o. female  CC:  Chief Complaint  Patient presents with  . Elevated CA-125    Assessment/Plan:  Ms. Yamna Mackel. is a 51 y.o.   Vaginal bleeding Jasmine Meuse V. presented with photos and evidence of bright red blood.  On examination today and vascular external hemmorhoid was appreciated. Previous colonoscopy 2 years ago, Jasmine Gao.  States that the bleeding more likely is vaginal.  An MRI did not identify any endometriotic lesions within the vagina and physical examination is unremarkable.   I have advised her to follow-up with Jasmine [name] for a trial of continuous OCPs for 3 months.  If the cyclic pain and vaginal bleeding cease it will be very helpful in identifying an etiology for this discomfort Advised against constipation  Cyclic pelvic pain Imaging without any suspicious changes.  Reports cyclic pelvic discomfort. Offered lsc to evaluate the pelvis with consideration for ablation of endometriosis if encountered, or referral to pelvic pain clinic.  I advised a trial of OCPs administered continuously for 3 months as a diagnostic evaluation.   Rising CA 125 The CA-125's were collected approximate 28 days apart presumably at the time of the follicular phase of her cycle and increase in the value is noted. On ultrasound the cyst appeared simple and without any concerning characteristics. MRI 09/05/2016 is unremarkable.   Initial CA 1 in March 2018 was 102 , Ca125 September 24, 2016 36.7.  CA125 March 30, 2017 35.7 Follow-up in 6 months   HPI: Ms. Jasmine Mcbeth. is a 51 y.o.   gravida 1 para 0 status post hysterectomy in 2002 for uterine leiomyomas. She reports cyclic spotting since September 2008. The discharge is brown and associated bloating and abdominal discomfort similar to the premenstrual discomfort she experienced prior to her hysterectomy.  CA-125 on for  every second 2018 65. Value was repeated on 07/06/2016 returned a value of 102. A pelvic ultrasound dated for every 22nd 2018 is notable for left ovary measuring 3.2 x 3.1 x 2.9 cm and a simple cyst the right ovary is very difficult to visualize with a 12 mm simple follicle was identified. There is no fluid in the cul-de-sac.  There is no family history of GYN malignancy. Colonoscopy 2 years ago was within normal limits.  MRI 08/2016 unremarkable.  Reports several instances of bright red blood when cleaning external genitalia.   CA 125 on June 7 was 36.7 and March 30, 2017 the value was 35.7.  Jasmine Meuse V.  to keep a menstrual calendar.  The dates of vaginal bleeding are March 08, 2017 January 09 2424 December 03, 2014 July 25 May 20 21st April 26 and 29 March 9 February 7 May 16, 2016   Review of Systems:  Constitutional  Feels well,  Cardiovascular  No chest pain, shortness of breath, or edema  Pulmonary  No cough or wheeze.  Gastro Intestinal  No nausea, vomitting, or diarrhoea. No bright red blood per rectum, no abdominal pain, change in bowel movement, or constipation.  Genito Urinary  No frequency, urgency, dysuria, bleeding with wiping, no change in appetite no bloating reports constipation but this is long-standing Musculo Skeletal  No myalgia, arthralgia, joint swelling or pain  Neurologic  No weakness, numbness, change in gait,  Psychology  No depression, anxiety, insomnia.    Current Meds:  Outpatient Encounter Medications as of 04/08/2017  Medication Sig  .  amitriptyline (ELAVIL) 10 MG tablet Take 40 mg by mouth at bedtime.   Marland Kitchen atenolol (TENORMIN) 25 MG tablet Take 50 mg by mouth at bedtime.  . chlorzoxazone (PARAFON) 500 MG tablet Take 500 mg by mouth 4 (four) times daily as needed for muscle spasms.   Marland Kitchen ibuprofen (ADVIL,MOTRIN) 600 MG tablet Take 600 mg by mouth every 6 (six) hours as needed for moderate pain.   Marland Kitchen levothyroxine (SYNTHROID,  LEVOTHROID) 125 MCG tablet Take 125 mcg by mouth daily before breakfast.  . Multiple Vitamin (MULTIVITAMIN WITH MINERALS) TABS tablet Take 1 tablet by mouth daily with lunch.  . naratriptan (AMERGE) 2.5 MG tablet Take 2.5 mg by mouth See admin instructions. Take one (1) tablet at onset of headache; if returns or does not resolve, may repeat after 4 hours; do not exceed five (5) mg in 24 hours.  . polyethylene glycol (MIRALAX / GLYCOLAX) packet Take 17 g by mouth daily as needed for mild constipation.   . Psyllium (METAMUCIL PO) Take 5 mLs by mouth daily as needed (constipation).    No facility-administered encounter medications on file as of 04/08/2017.     Allergy: No Known Allergies  Social Hx:   Social History   Socioeconomic History  . Marital status: Married    Spouse name: Not on file  . Number of children: Not on file  . Years of education: Not on file  . Highest education level: Not on file  Social Needs  . Financial resource strain: Not on file  . Food insecurity - worry: Not on file  . Food insecurity - inability: Not on file  . Transportation needs - medical: Not on file  . Transportation needs - non-medical: Not on file  Occupational History  . Not on file  Tobacco Use  . Smoking status: Never Smoker  . Smokeless tobacco: Never Used  Substance and Sexual Activity  . Alcohol use: No    Alcohol/week: 0.0 oz  . Drug use: No  . Sexual activity: Yes  Other Topics Concern  . 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 ABDOMINAL HYSTERECTOMY  10/27/2010   Procedure: HYSTERECTOMY ABDOMINAL;  Surgeon: Marylynn Pearson;  Location: [facility];  Service: Gynecology;  Laterality: N/A;  . APPENDECTOMY  07/18/2015  . BREAST LUMPECTOMY Left 2009  . DILATION AND CURETTAGE, DIAGNOSTIC / THERAPEUTIC  2002  . LAPAROSCOPIC  APPENDECTOMY N/A 07/18/2015   Procedure: APPENDECTOMY LAPAROSCOPIC;  Surgeon: Judeth Horn, MD;  Location: [facility];  Service: General;  Laterality: N/A;  . MYOMECTOMY  2002  . THYROIDECTOMY      Past Medical Hx:  Past Medical History:  Diagnosis Date  . Acid reflux   . Blood transfusion 2000   "related to fibroids"  . Helicobacter pylori ab+   . Hyperthyroidism    thyrodectomy 2006  . Hypothyroidism   . Migraine    "q couple weeks" (07/19/2015)  . Thyroid cancer ([place]) 2006    Past Gynecological History: Gravida 1 para 0 menarche occurred at age of 89 with regular menses until hysterectomy in 2012 for leiomyoma. Reports history of oral contraceptive pill use for 3-5 years. No history of abnormal Pap test Patient's last menstrual period was 09/30/2010.  Family Hx:  Family History  Problem Relation Age of Onset  .  Hypertension Mother   . Rheum arthritis Mother   . Osteoporosis Mother   . Hypertension Sister   . Hypothyroidism Sister   . CAD Paternal Uncle   . CAD Cousin   . Colon cancer Neg Hx     Vitals:  Blood pressure 117/77, pulse 73, temperature 98.1 F (36.7 C), temperature source Oral, resp. rate 18, height 5\' 4"  (1.626 m), weight 131 lb 12.8 oz (59.8 kg), last menstrual period 09/30/2010, SpO2 100 %.  Physical Exam: WD in NAD Neck  Supple NROM, without any enlargements.  Lymph Node Survey No cervical supraclavicular or inguinal adenopathy Cardiovascular  Pulse normal rate, regularity and rhythm.  Lungs  Clear to auscultation bilaterally, Skin  No rash/lesions/breakdown  Psychiatry  Alert and oriented appropriate mood affect speech and reasoning. Abdomen  Normoactive bowel sounds, abdomen soft, non-tender. Pfannenstiel incision without masses or hernia Back No CVA tenderness Genito Urinary  Vulva/vagina: Normal external female genitalia.  No lesions. No discharge or bleeding.  Bladder/urethra:  No lesions or masses  Vagina: Well estrogenized, no masses  nodularity or lesions  Cervix: Absent.  Uterus: Absent Adnexa: No palpable masses. Rectal  Good tone, no masses no cul de sac nodularity.  External hemorrhoid, erythematous  with visible superficial vessel. Extremities  No bilateral cyanosis, clubbing or edema.   Janie Morning, MD, PhD 04/08/2017, 9:33 AM

## 2017-04-08 NOTE — Patient Instructions (Signed)
Plan to follow up with Dr. Julien Girt in January to begin continuous birth control pills to try to assist with the cyclic pain you are having. Plan to follow up with Dr. Skeet Latch in six months or sooner if needed.

## 2017-05-18 MED FILL — NARATRIPTAN HCL 2.5 MG TAB: 2.5 | 30 days supply | Qty: 8 | Fill #0

## 2017-05-20 IMAGING — CT CT ABD-PELV W/ CM
2 of 5 series · 10 of 46 positions shown, 11 images · IV contrast (Iodine)
Comparison: Pelvic ultrasound performed 11/11/2007

CLINICAL DATA: Acute onset of worsening right-sided abdominal pain.
Abdominal bloating and cramping. Initial encounter.

EXAM:
CT ABDOMEN AND PELVIS WITH CONTRAST
TECHNIQUE: Multidetector CT imaging of the abdomen and pelvis was performed
using the standard protocol following bolus administration of
intravenous contrast.
CONTRAST:  100mL OMNIPAQUE IOHEXOL 300 MG/ML  SOLN

[Series 201: routine, idose (2) · axial · 0.83mm/px · z∈[-28,+307]mm · 7 of 87 slices shown, 8 images]
[im 10/87  soft-tissue]
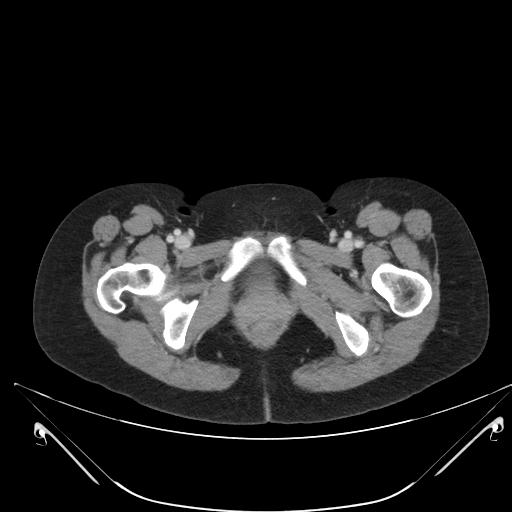
[im 10/87  bone]
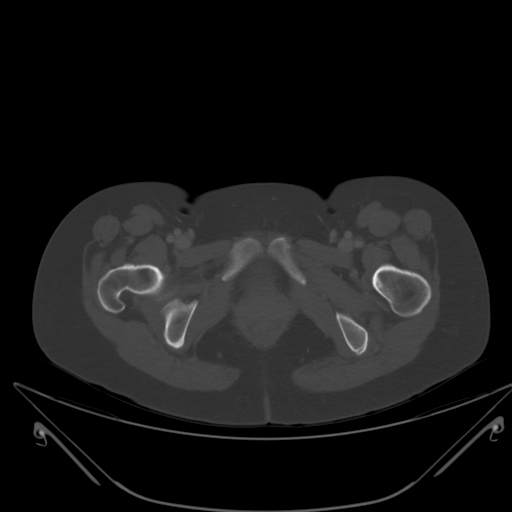
[im 20/87  soft-tissue]
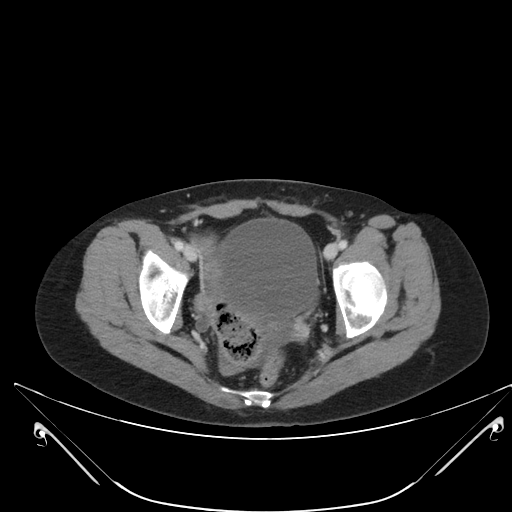
[im 34/87  soft-tissue]
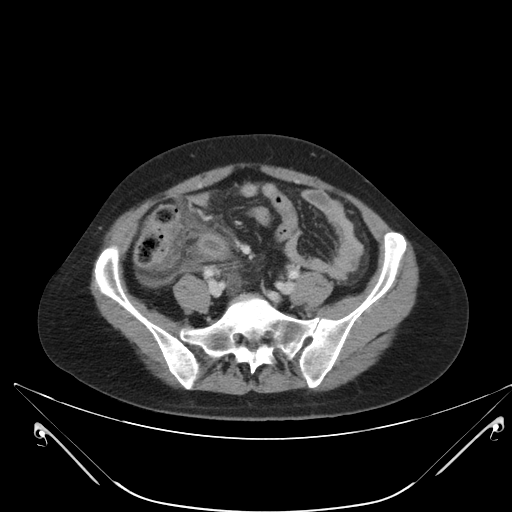
[im 44/87  soft-tissue]
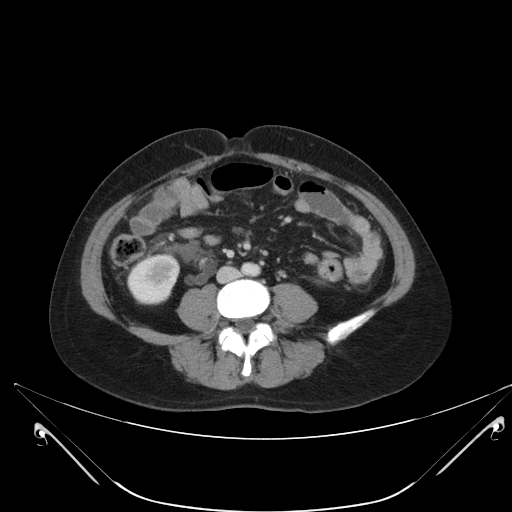
[im 53/87  soft-tissue]
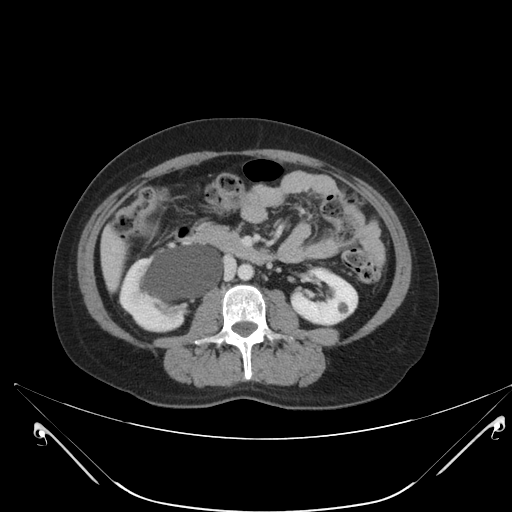
[im 67/87  soft-tissue]
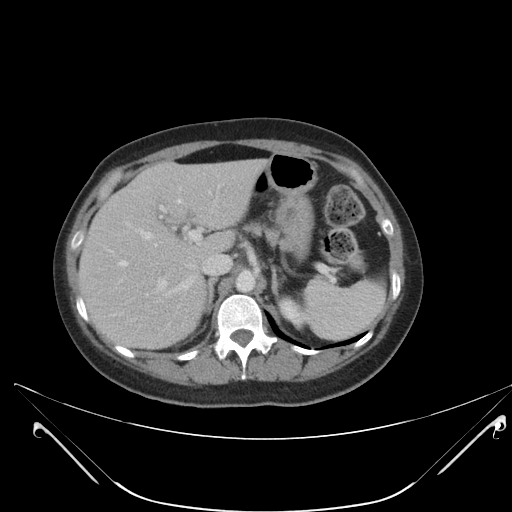
[im 77/87  soft-tissue]
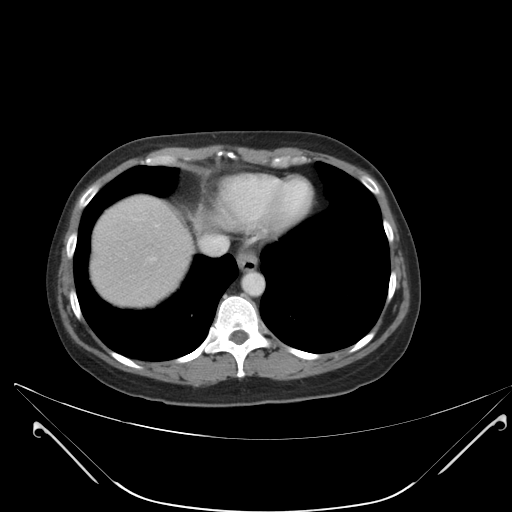

[Series 203: coronals, idose (2) · coronal · 0.45mm/px · 3 of 113 slices shown]
[im 38/113  soft-tissue]
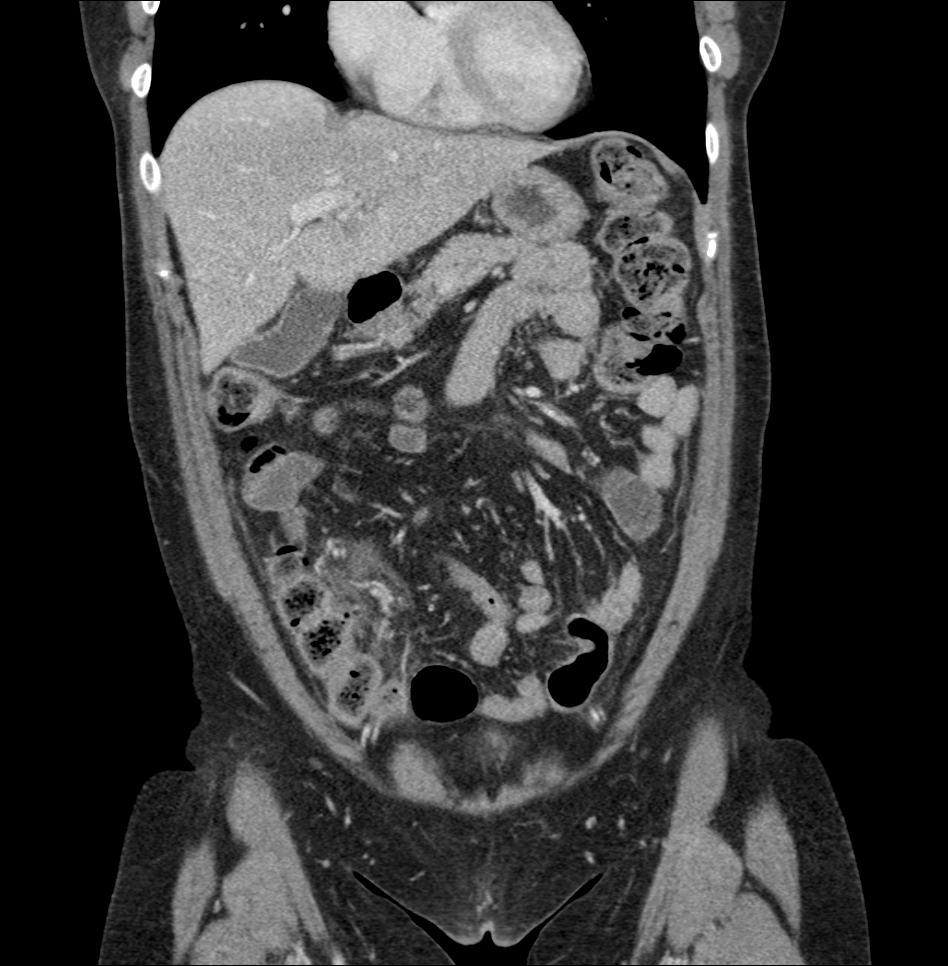
[im 50/113  soft-tissue]
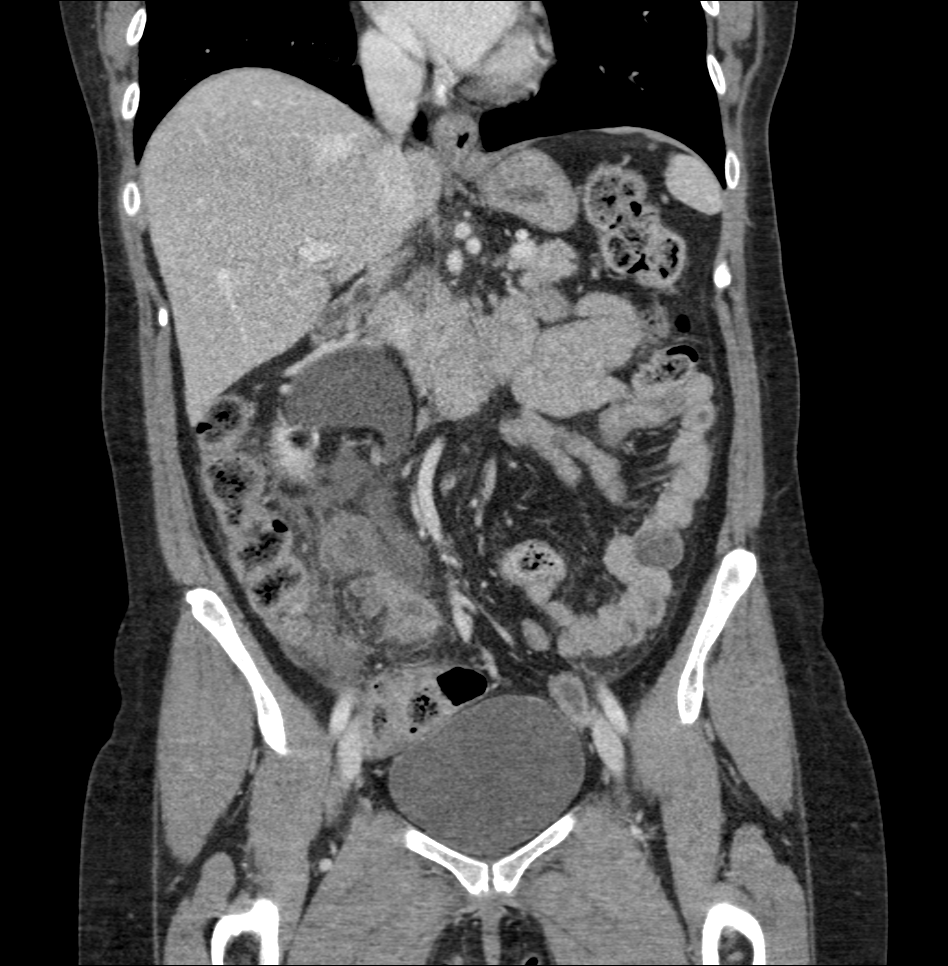
[im 63/113  soft-tissue]
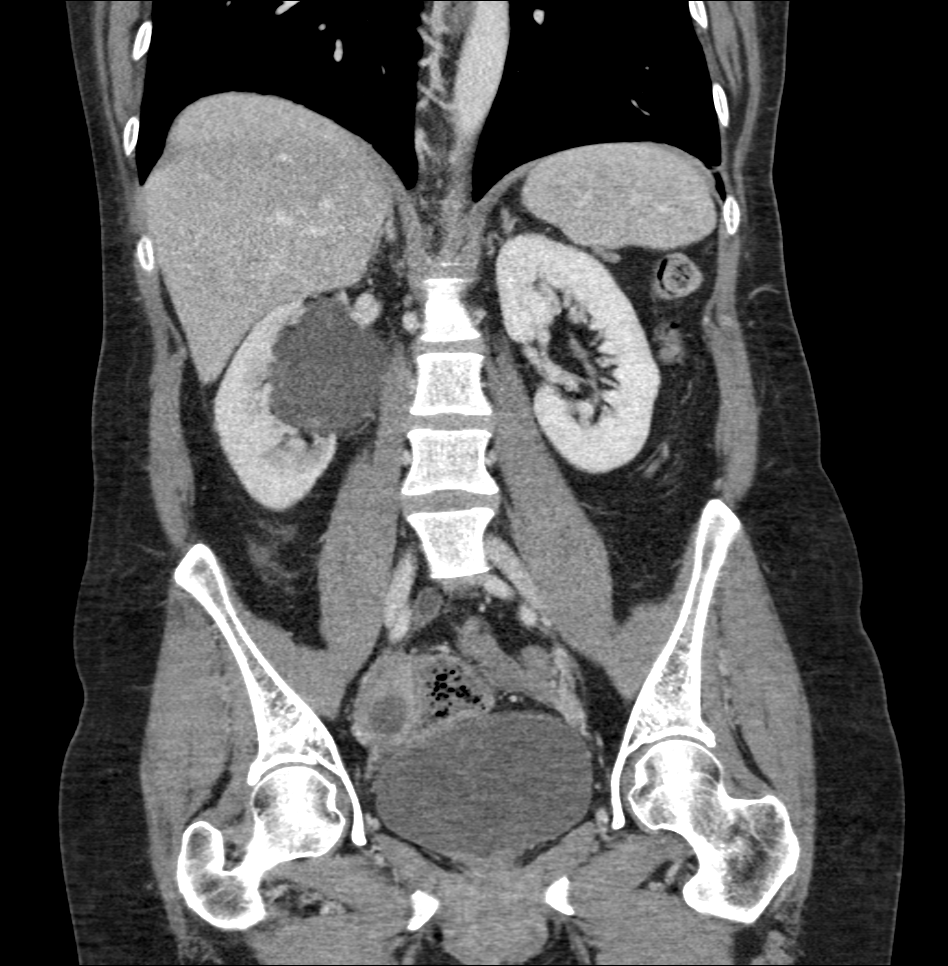

[10 of 46 positions shown; findings below may reference images not displayed]

FINDINGS: A calcified granuloma is noted at the left lung base.

A few small hypodensities within the liver are nonspecific but may
reflect small cysts. The spleen is unremarkable in appearance. The
gallbladder is within normal limits. The pancreas and adrenal glands
are unremarkable.

There is severe relatively chronic appearing right-sided
hydronephrosis, with diffuse distention of the right ureter. This
may reflect the underlying appendiceal process, though a distal
stricture cannot be entirely excluded. Would correlate as to whether
hydronephrosis persists following appendectomy.

Scattered left renal cysts are noted. No renal or ureteral stones
are identified. No significant perinephric stranding is seen.

The small bowel is unremarkable in appearance. The stomach is within
normal limits. No acute vascular abnormalities are seen.

The appendix is dilated to 2.3 cm in diameter, with diffuse
surrounding soft tissue inflammation and marked wall thickening.
Multiple diverticula are noted arising from the appendix, measuring
up to 2.4 cm in size. The appendix is located medial to the cecum
and ascending colon. There is no definite evidence of perforation at
this time. Surrounding phlegmon and free fluid are seen.

The colon is partially filled with stool and is grossly unremarkable
in appearance. A small amount of free fluid within the pelvis may
reflect the acute appendicitis.

The bladder is mildly distended and grossly unremarkable. The
patient is status post hysterectomy. No suspicious adnexal masses
are seen. The ovaries are relatively symmetric. No inguinal
lymphadenopathy is seen.

No acute osseous abnormalities are identified.
IMPRESSION: 1. Acute appendicitis, with dilatation of the appendix to 2.3 cm in
diameter, diffuse surrounding soft tissue inflammation and marked
wall thickening. Multiple diverticula arising from the appendix, the
largest of which measures 2.4 cm. The appendix is located medial to
the cecum and ascending colon. No definite evidence of perforation
at this time, though surrounding phlegmon and free fluid are seen.
2. Small amount of free fluid within the pelvis may reflect the
acute appendicitis.
3. Severe relatively chronic appearing right-sided hydronephrosis,
with diffuse distention of the right ureter. This may reflect the
underlying appendicitis, though a distal stricture cannot be
entirely excluded. Would correlate as to whether right-sided
hydronephrosis persists following appendectomy, and treat as deemed
clinically appropriate.
4. Scattered left renal cysts noted.
5. Few small hypodensities within the liver are nonspecific but may
reflect small cysts.
These results were called by telephone at the time of interpretation
on 07/18/2015 at [DATE] to Dr. NABII ISAYA KIRIGA, who verbally
acknowledged these results.

## 2017-05-20 MED FILL — CHLORZOXAZONE 500 MG TABLET: 500 | 10 days supply | Qty: 30 | Fill #1

## 2017-06-14 MED FILL — SYNTHROID 125 MCG TABLET: 125 | 90 days supply | Qty: 90 | Fill #0

## 2017-06-29 MED FILL — AMITRIPTYLINE HCL 10 MG TAB: 10 | 90 days supply | Qty: 360 | Fill #2

## 2017-06-29 MED FILL — NARATRIPTAN HCL 2.5 MG TAB: 2.5 | 30 days supply | Qty: 8 | Fill #1

## 2017-06-29 MED FILL — CHLORZOXAZONE 500 MG TABLET: 500 | 10 days supply | Qty: 30 | Fill #0

## 2017-07-09 MED FILL — TRIAMCINOLONE 0.1% OINTMEN: 0.1 | 15 days supply | Qty: 30 | Fill #0

## 2017-07-13 MED FILL — PREVIDENT 5000 BOOSTER PLUS: 1.1 | 30 days supply | Qty: 100 | Fill #0

## 2017-07-23 MED FILL — ONDANSETRON HCL 4 MG TABLET: 4 | 3 days supply | Qty: 10 | Fill #0

## 2017-08-11 MED FILL — BUTALB-ACETAMIN-CAFF 50-300: 50-300-40 | 5 days supply | Qty: 60 | Fill #0

## 2017-08-11 MED FILL — ATENOLOL 25 MG TABLET: 25 | 90 days supply | Qty: 180 | Fill #0

## 2017-08-12 MED FILL — NARATRIPTAN HCL 2.5 MG TAB: 2.5 | 30 days supply | Qty: 8 | Fill #2

## 2017-08-19 MED FILL — ESTRADIOL 0.1 MG/GM CREA: 0.1 | 90 days supply | Qty: 43 | Fill #0

## 2017-08-23 MED FILL — CHLORZOXAZONE 500 MG TABS: 500 | 30 days supply | Qty: 270 | Fill #0

## 2017-08-23 MED FILL — TRIAMCINOLONE 0.1% OINTMEN: 0.1 | 90 days supply | Qty: 60 | Fill #0

## 2017-08-24 MED FILL — SYNTHROID 137 MCG TABLET: 137 | 90 days supply | Qty: 90 | Fill #0

## 2017-09-03 MED FILL — FEXOFENADINE HCL 180 MG TAB: 180 | 30 days supply | Qty: 30 | Fill #0

## 2017-09-03 MED FILL — MELOXICAM 15 MG TABLET: 15 | 90 days supply | Qty: 90 | Fill #0

## 2017-09-07 MED FILL — NARATRIPTAN HCL 2.5 MG TAB: 2.5 | 90 days supply | Qty: 24 | Fill #3

## 2017-09-14 MED FILL — PREVIDENT 5000 BOOSTER PLUS: 1.1 | 30 days supply | Qty: 100 | Fill #1

## 2017-09-28 ENCOUNTER — Ambulatory Visit: Admit: 2017-09-28 | Discharge: 2017-09-28

## 2017-09-28 DIAGNOSIS — A159 Respiratory tuberculosis unspecified: Secondary | ICD-10-CM

## 2017-10-07 ENCOUNTER — Inpatient Hospital Stay: Payer: Self-pay | Attending: Gynecologic Oncology | Admitting: Gynecologic Oncology

## 2019-02-17 NOTE — Telephone Encounter (Signed)
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 [facility] Employee    What is your insurance type (don't ask [employer] or [employer] or [employer] residents/[employer]/[place] residents)? [place] - ED (yes), [place] (no), VACC (no)    What is the reason for your call:exposure only; Reason for Call: [facility] Exposure    If symptomatic, have you been seen by a provider for these symptoms? N/A    Are you part of a contact tracing or outbreak investigation? Yes    In the past 14 days, have you been tested for COVID? No     Have you been given a code to use (word or number)?No     Part I: Exposure history     1. In the past 14 days, have you been in unprotected close contact with someone diagnosed with COVID-19 (close contact = within 6 feet for 15 minutes or longer)?   yes, EE was in contact with another co worker who tested covid+. EE and co worker were most likely together in the break room for around 30 mins. EE and co worker were most likely not wearing masks because they were eating.     2. Have you returned from travel outside the [place] in the past 14 days? No    3. In the past 14 days, have you worked at a non-[facility] health care facility and participated in direct care or had close contact with COVID-19 confirmed patients?  ([facility] facilities include: [facility], [facility], [facility], [facility], and all [facility] sites (clinics and diagnostic/treatment departments). [facility] and [facility] are also considered equivalent to  health care facilities) No    4. In the past 14 days, have you lived with someone who is confirmed COVID-19 positive? No    5. Do you live with  someone who is waiting for COVID test results and has COVID symptoms? No    Part II: Symptoms   "The next two questions are to make sure you are not having a medical emergency"    6.  Are you currently having severe, continuous pain or pressure in your chest (not just with coughing or breathing)? No    7. Are you currently having shortness of breath that is unusual for you? (For example, are you having to stop and catch your breath more than usual while walking or going up stairs?)   No    In the past 10 days, have you had any of the following symptoms: (#8-12)    8. Have you had a cough? No    9. Have you had any fever, chills shivering or shakes (? 37.80C / 1000F) No    10. Have you been so weak or dizzy that you cannot stand? No    11. Have you had any vomiting? No    12. Have you had any of the following symptoms?    Unexplained muscle aches No   Sore Throat No   Runny or congested nose  No   Feeling unusually weak or fatigued No   Loss of sense of smell or taste No   Diarrhea (defined as ? 3  loose stools in 24 hrs) No   Eye redness +/- discharge (pink eye, not related to allergies) No        Part III: Immunocompromised and Hospitalized    13. Do you have any conditions that weaken your immune system? No    14. Did your COVID-related symptoms require you to need oxygen or to be hospitalized? N/A         14A. (yes to 14) Has it been at least 20 days since these symptoms started, and at least 3 days since your last fever or chills? N/A    Part IV: Additional Employee Information    Are you an employee who has direct patient care responsibilities?  Yes     If yes then do you work on any of the High Risk Units?    [place]: 11L/12L  [place]: C3 ICN, C6 Heme/Onc, C6 BMT, [facility]? No         Disposition  [x]  This telephone encounter was routed to the Occupational Health Nurse Triage Team pool for further evaluation

## 2019-02-17 NOTE — Telephone Encounter (Signed)
CT  PACU RN, Andree Moro is lead- Spoke with EE- She worked on 10/26,10/27 & 02/15/19 with no close contact with index. EE asymptomatic. Follow- up with [facility] should she become symptomatic.

## 2019-02-17 NOTE — Telephone Encounter (Signed)
Called employee to go over exposure screening. Requested to call back in an hour as she is currently busy.      Tawnya Crook, BSN, RN-BC  Office of [facility] Triage Team

## 2019-02-17 NOTE — Telephone Encounter (Signed)
Exposure Screening    Employee confirms that they are currently asymptomatic for COVID-compatible symptoms as noted in health care navigator note. Yes     Employee Role/Job Title: Gaffer location/Unit: [place] [facility]    Date last worked onsite: 02/15/2019    Date of symptom onset:   Are you currently working remotely? No  Relationship to COVID-19 confirmed case: Co-Worker  On what date did the COVID-19 confirmed case test positive for COVID-19? "unsure", asked Surveyor, quantity, said Monday, Tuesday, or Wednesday (10/26, 27, 28)    How were you notified of the confirmed COVID-19 case? Manager let me know    Date/Time of Interaction: 10/26, 27, 28 (unsure when since she doesn't know who the coworker is)    At the time of interaction with the confirmed case, did they have any symptoms? No   If "YES" what symptoms were they having?     What type of contact did you have with the confirmed case? Unsure, manager informed employee to call in regarding an exposure but employee does not know who the index is     What was the frequency of contact with the confirmed case? One-Time    What was the estimated distance between you and the confirmed case during your interaction? < 6 feet (unsure but was told they probably had lunch together in break room)    How much time was spent with the confirmed case ? 30 Minutes or more (unsure)    What PPE was the confirmed case using during your interaction? None    What PPE were you using during your interaction with the confirmed case? None    Were other employees or patients potentially exposed to the confirmed case?  Yes   If "YES" list only the titles of the employee who were exposed: unsure, manager/ [facility] MZ is trying to conduct contact tracing  Were you preforming an aerosol-generating procedure? (This includes: Intubation, high-flow nasal cannula, non-invasive ventilation and/or tracheostomy) Unknown    Informed employee that case will be escalated to [facility])  [facility] to determine risk of exposure, if testing is indicated, and to provide return to work guidance.  Employee may remain at work until they are contacted by [employer] as long as they remain asymptomatic.      Tawnya Crook, BSN, RN-BC  Office of [facility] Triage Team

## 2019-04-25 ENCOUNTER — Ambulatory Visit: Admit: 2019-04-25 | Discharge: 2019-04-25 | Payer: Self-pay

## 2019-04-25 DIAGNOSIS — Z23 Encounter for immunization: Secondary | ICD-10-CM

## 2019-05-17 ENCOUNTER — Ambulatory Visit: Admit: 2019-05-17 | Discharge: 2019-05-17 | Payer: Self-pay

## 2019-05-17 DIAGNOSIS — Z23 Encounter for immunization: Secondary | ICD-10-CM

## 2020-02-14 ENCOUNTER — Ambulatory Visit: Admit: 2020-02-14 | Discharge: 2020-02-14 | Payer: Self-pay

## 2020-02-14 DIAGNOSIS — Z23 Encounter for immunization: Secondary | ICD-10-CM

## 2020-10-04 ENCOUNTER — Ambulatory Visit: Admit: 2020-10-04 | Discharge: 2020-10-04 | Payer: Self-pay

## 2020-10-04 DIAGNOSIS — Z23 Encounter for immunization: Secondary | ICD-10-CM

## 2020-10-24 LAB — COVID-19, ANTIGEN - EXTERNAL: COVID-19, ANTIGEN - EXTERNAL: DETECTED — AB

## 2020-10-30 NOTE — Telephone Encounter (Signed)
erroe

## 2020-11-04 LAB — BASIC METABOLIC PANEL (NA, K, CL, CO2, BUN, CR, GLU, CA)
Anion Gap: 6 (ref 4–14)
Calcium, total, Serum / Plasma: 9.2 mg/dL (ref 8.4–10.5)
Carbon Dioxide, Total: 28 mmol/L (ref 22–29)
Chloride, Serum / Plasma: 103 mmol/L (ref 101–110)
Creatinine: 0.76 mg/dL (ref 0.55–1.02)
Glucose, non-fasting: 102 mg/dL (ref 70–199)
Potassium, Serum / Plasma: 3.8 mmol/L (ref 3.5–5.0)
Sodium, Serum / Plasma: 137 mmol/L (ref 135–145)
Urea Nitrogen, Serum / Plasma: 13 mg/dL (ref 7–25)
eGFRcr: 93 mL/min/{1.73_m2} (ref >59)

## 2020-11-04 LAB — COMPLETE BLOOD COUNT WITH DIFFERENTIAL
Abs Basophils: 0.05 10*9/L (ref 0.00–0.10)
Abs Eosinophils: 0.07 10*9/L (ref 0.00–0.40)
Abs Imm Granulocytes: 0.03 10*9/L (ref <0.10)
Abs Lymphocytes: 2.21 10*9/L (ref 1.00–3.40)
Abs Monocytes: 0.48 10*9/L (ref 0.20–0.80)
Abs Neutrophils: 4.81 10*9/L (ref 1.80–6.80)
Hematocrit: 47.2 % — ABNORMAL HIGH (ref 36.0–46.0)
Hemoglobin: 16.1 g/dL — ABNORMAL HIGH (ref 12.0–15.5)
MCH: 29 pg (ref 26.0–34.0)
MCHC: 34.1 g/dL (ref 31.0–36.0)
MCV: 85 fL (ref 80–100)
Platelet Count: 334 10*9/L (ref 140–450)
RBC Count: 5.56 10*12/L — ABNORMAL HIGH (ref 4.00–5.20)
WBC Count: 7.7 10*9/L (ref 3.4–10.0)

## 2020-11-04 MED ORDER — SODIUM CHLORIDE 0.9 % IV BOLUS
0.9 % | INTRAVENOUS | Status: AC
  Administered 2020-11-04: 17:00:00 1000 mL via INTRAVENOUS

## 2020-11-04 MED ORDER — ACETAMINOPHEN 500 MG TABLET
500 mg | ORAL | Status: AC
  Administered 2020-11-04: 17:00:00 via ORAL

## 2020-11-04 MED FILL — ACETAMINOPHEN 500 MG TABLET: 500 mg | ORAL | Qty: 2

## 2020-11-04 NOTE — ED Provider Notes (Cosign Needed)
ED Attendings              History     Chief Complaint   Patient presents with    Syncope     Pre syncope while at work. No shortness of breath. NO CP. No NVD.        HPI  [name] hx thyroid ca s/p thyroidectomy now w/ hypothyroidism, migraines, recent covid infx, p/w lightheadedness    -diagnosed w covid earlier this month, today was 1st day returning to work (here at [employer])  -felt lightheaded while standing, had to sit down, felt like she might pass out, was accompanied by diaphoresis and "vision fading slowly"  -was sent to ED by coworkers  -at present feels back to baseline  -denies at any point feeling cp, dyspnea, nausea, neuro deficit; no actual syncope/fall    Allergies/Contraindications  No Known Allergies                  Social History     Socioeconomic History    Marital status: Married       Review of Systems     Review of Systems   Constitutional: Negative for chills and fever.   HENT: Negative for sore throat.    Eyes: Negative for visual disturbance.   Respiratory: Negative for shortness of breath.    Cardiovascular: Negative for chest pain.   Gastrointestinal: Negative for abdominal pain, diarrhea, nausea and vomiting.   Genitourinary: Negative for dysuria.   Musculoskeletal: Negative for myalgias.   Skin: Negative for rash.   Neurological: Positive for light-headedness. Negative for syncope and headaches.   Psychiatric/Behavioral: Negative for confusion.       Physical Exam   Triage Vital Signs:  BP: 131/81, Heart Rate: 79, Pulse - Palpated/Pleth: 79, Temp: 36.7 C (98.1 F), *Resp: 16, SpO2: 100 %    Physical Exam  Vitals and nursing note reviewed.   Constitutional:       Appearance: She is well-developed.   HENT:      Head: Normocephalic and atraumatic.      Right Ear: External ear normal.      Left Ear: External ear normal.   Eyes:      Pupils: Pupils are equal, round, and reactive to light.   Cardiovascular:      Rate and Rhythm: Normal rate and regular rhythm.      Pulses: Normal pulses.    Pulmonary:      Effort: Pulmonary effort is normal. No respiratory distress.   Abdominal:      Palpations: Abdomen is soft.      Tenderness: There is no abdominal tenderness.   Musculoskeletal:         General: No signs of injury.      Cervical back: Normal range of motion and neck supple.   Skin:     General: Skin is warm and dry.   Neurological:      General: No focal deficit present.      Mental Status: She is alert and oriented to person, place, and time.      Cranial Nerves: No cranial nerve deficit.      Motor: No weakness.      Coordination: Coordination normal.      Gait: Gait normal.      Comments: Able to stand and walk without difficulty   Psychiatric:         Thought Content: Thought content normal.          Initial Assessment (  problem list and differential diagnosis)     Ddx for presyncope includes vasovagal, orthostatic, electrolyte disturbance. Doubt ACS, arrhythmia, CVA given reassuring exam and history. Pt feels asymptomatic now. May still be recovering fully from recent covid infection.    -labs  -ivf  -discharge if remains stable/asymptomatic        Interpretations:  Lab, Imaging, and ECG     Pertinent below    Reassessment and Final Disposition     ED Disposition: Data Unavailable      Discharged after 1L IVF, remains asymptomatic    ED Course as of 11/04/20 0952   Charlies Constable Alyanah Elliott's Documentation   Mon Nov 04, 2020   0951 CBC and BMP unremarkable, no electrolyte derangement or anemia.   0952 EKG normal sinus, rate 73, narrow QRS, no TWI or ST changes.         Charlies Constable Christana Angelica, MD  Resident  11/05/20 [phone]

## 2020-11-04 NOTE — Unmapped (Signed)
It was our pleasure to care for you at the [facility] Emergency Department.  You presented with lightheadedness.  You were evaluated with a physical exam, labs, EKG, and given an IV fluid bolus.    Based on this workup, we are reassured that the cause of your symptoms does not require admission to the hospital at this time.    We recommend:  - Continue to hydrate and rest at home    Seek care if you experience fever, shortness of breath, severe chest pain, or any new or worsening symptoms.

## 2020-11-05 LAB — ECG 12-LEAD
Atrial Rate: 73 {beats}/min
Calculated P Axis: 29 degrees
Calculated R Axis: 4 degrees
Calculated T Axis: 21 degrees
P-R Interval: 148 ms
QRS Duration: 76 ms
QT Interval: 408 ms
QTcb: 449 ms
Ventricular Rate: 73 {beats}/min

## 2021-10-14 NOTE — Telephone Encounter (Signed)
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 Trying to reach with chart which was at end of bed, ribs got pressed on side rail of bed and heard a "pop". Felt pain after 15 minutes    Location:  Left ribs  Quality of pain: Tender to touch  Radiation of pain: no  Severity:  4/10 (8/10 at time of injury)  Duration:  intermittent  Exacerbating and alleviating: ice pack with some relief, rest with some relief, difficulty with deep breathing, denies any respiratory issues, certain movements will exacerbate pain      Employee does not have a history of prior workers' Water engineer.   Brief description of prior claims (if applicable): N/A    Have you seen a provider for this?:  Yes, Seen by PCP for phone call 06/20    PMH:      Allergies:   Meds:       Consulted with provider:      Plan: rest when able,limit lifting, pushing, pulling any heavy items, continue to use ice pack/heat pack, still able to do ROM without difficulty, has mild discomfort in some movements, On Sunday night 06/25 symptoms worsen. Offered first available appointment with NP,  EE requested appt for 07.05, if symptoms worsen, advised to go to ED.

## 2021-10-20 ENCOUNTER — Ambulatory Visit
Admit: 2021-10-20 | Payer: PRIVATE HEALTH INSURANCE | Primary: Student in an Organized Health Care Education/Training Program

## 2021-10-20 ENCOUNTER — Ambulatory Visit: Admit: 2021-10-20 | Payer: PRIVATE HEALTH INSURANCE | Attending: Family

## 2021-10-20 DIAGNOSIS — R0781 Pleurodynia: Secondary | ICD-10-CM

## 2021-10-20 MED ORDER — LEVOTHYROXINE 112 MCG TABLET
112 | Freq: Every day | ORAL | 3.00 refills | 90.00000 days | Status: AC
Start: 2021-10-20 — End: ?

## 2021-10-20 MED ORDER — MULTIVITAMIN WITH FOLIC ACID 400 MCG TABLET
Freq: Every day | ORAL | Status: AC
Start: 2021-10-20 — End: ?

## 2021-10-20 MED ORDER — ELETRIPTAN 20 MG TABLET
20 mg | Freq: Once | ORAL | Status: AC | PRN
Start: 2021-10-20 — End: ?

## 2021-10-20 NOTE — Progress Notes (Signed)
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  Concurrent Employment: No other employment   Soc Hx: Lives in [place] with husband. Likes to rest, and some yoga.         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 Instant 8/10 pain on the side, used ice pack. NOW "much better" "definitely" Feels 80% improved, intermittent 3/10 pain with certain position, with sudden move to the R side, or with touch. No pain with walking, or breathing. Improved by: rest. MEDs: SalonPas, 2x/wk.  Sleeping: well, not interrupted by pain. Trying to sleep on the right side. Waking up: without pain or stiffness.  Denies numbness, tingling or weakness, swelling or redness.      MOI: Pressed rib cage against gurney rails   Prior Work Claims: Denies  Contributory Non-Industrial Injury: No prior injury. No MVA.   Alleviating Factors: rest  Exacerbating Factors: certain position, with sudden move to the R side, or with touch  Prior care or tx by PCP or ED:  YES.    PCP phone appointment on 10/07/2021, and completed an XR, and not fractures notes. Advised to continue with SalonPas, and Ibuprofen (NSAIDs).    Able to maintain ADLs (cook/ clean/ wash self/ dress self):  YES, at baseline  Drives: Doesn't drive (usually)      Medications Taking:  Ibuprofen (NSAIDs)  Levothyroxine   Eletriptan   Atenalol (unsure the reason why  taken)  Amitriptyline  Multivitamins   Vit-C     Reports PMHx: Hypothyroidism. Migraines Insomnia.   Denies Hx: Heart Murmur, HTN, Heart condition/surgery, Asthma or pulmonary diagnosis, Allergies, GI Hx (Ulcer/Gastritis/GERD), Diabetes, Cancer, or Surgery      ROS at Initial Encounter on 10/20/2021    Constitutional: denies fevers, chills, general malaise   Skin: denies rashes or itching   HENT:denies hearing loss, ear pain, congestion or sore throat  Eyes: denies vision changes or eye pain   Cardiovascular: denies chest pain, pressure or tightness. Denies palpitations or LE swelling  Respiratory: denies cough, hemoptysis, shortness of breath or wheezing   Gastrointestinal: denies abdominal pain, nausea, vomiting, or blood in the stool  Genitourinary: denies dysuria, flank pain or frequency.   Musculoskeletal: see above.   Hematology/Endocrine: denies easy bruise/bleed, polydipsia, unexpected weight loss/gain   Neurological: denies generalized or focal weakness, tingling and numbness or sensory change   Psychiatric: denies anxiety, hallucinations or SI/HI    OBJECTIVE  Vitals:    10/20/21 0921   BP: 115/69   Pulse: 71   Temp: 36.5 C (97.7 F)   SpO2: 99%   Weight: 61.2 kg (135 lb)   Height: 165.1 cm (5\' 5" )      No LMP recorded. Patient has had a hysterectomy.    Physical  Exam  Constitutional:       Appearance: Normal appearance.   HENT:      Head: Normocephalic.   Pulmonary:      Effort: Pulmonary effort is normal.      Breath sounds: Normal breath sounds.   Abdominal:      General: Abdomen is flat.      Palpations: Abdomen is soft.          Comments: TTP on left lower costal rib. NO swelling, redness or erythema noted.    Musculoskeletal:         General: Normal range of motion.   Skin:     General: Skin is warm and dry.   Neurological:      Mental Status: She is alert.   Psychiatric:         Mood and Affect: Mood normal.         Behavior: Behavior normal.       ASSESSMENT  Visit Diagnoses     ICD-10-CM    1.  Costal margin pain  R07.81 XR Ribs, Left     Ambulatory referral to Physical Therapy     Ambulatory referral to Physical Therapy            PLAN    Plan Note initial 10/20/2021: Presentation consistent with Industrial injury. Left side of lower rib cage pressed against gurney side rails. Feels now 80% improved. Had a visit with PCP who ordered an XR, no fractures noted. On exam TTP on lower left costal rib, otherwise PE wnl. Requesting XR as report is not available to this provider. SalonPas continue. Discussed NSAIDs, with food and hydration. Requested PT. Discussed ED precautions. WS. Modified Duty. Follow up with Occupational Health on 10/31/2021.         Work Education officer, community, Restrictions as follows:  Sit down duty only       Medications: Take/Continue as previously directed   Naproxen 500mg  (NSAID - Non-steroidal Anti-Inflammatory Drug) twice daily by mouth with food and hydration as directed as needed for pain and inflammation. OTC. STOP ALL OTHER USE OF ANTI-INFLAMMATORY MEDICATION.   Prilosec 20mg  by mouth once daily for any stomach pain/nausea/upset related to NSAID use  Voltaren Gel 1% (OTC) Apply a small amount topically to affected area 4 times daily    Ice/Heat - as previously directed for discomfort and pain - Alternate ice and heat, applying each for 10-20 minutes 3 times daily on the painful area. Do not apply ice directly to skin as this may cause a burn. Place in a pillow case or towel before putting directly on your skin.     Gentle Home Stretches and Exercises as previously directed to maintain range of motion and relieve discomfort, stiffness, and tension     Imaging Requested: Advised to walk-in today to [facility] at [address], 2nd floor Radiology department to have this completed. The order has already been entered in Apex/Epic.  Xray Ribs ordered on 10/20/2021, pending result    Physical Therapy - 1st set of 6 sessions requested on 10/20/2021 for left side rib cage    [facility] - Physical Therapy   [facility] In-Network Provider)  [address]  [place], [place]  Phone: [phone]  Fax: [phone]     Follow up with Occupational Health on 10/31/2021, sooner and in person as needed for any new onset or worsening symptoms.  Call [phone] to speak with our administrative staff and schedule your appointment on or just before the date above.    Discussed ED red flags and return precautions and patient expressed understanding. All questions were answered. The patient verbally expressed understanding and agreement with the treatment plan above.    Please call [phone] to sign up for MyChart and the ability to get test results and make appointments online as well as directly send me messages. This is NOT to be used for urgent matters. If an urgent matters does arise, you MUST call the clinic or 911 (for emergencies). Please know that it may be take a couple days to get a response when you directly message me and other providers. We do our best to address your issues as quickly as we can.    NOTE:     This is an independent service.   The available consultant for this service is Murrell Converse, MD.             Cherlynn Polo, FNP-BC  [facility]    In compliance with Title 16, [place] Code of Regulations 808-015-0517, I verbally stated to the patient and/or legal guardian that I am a Publishing rights manager, not a physician or surgeon, and licensed and certified to evaluate and treat your medical needs I have the training and experience to care for your healthcare needs.  Patient verbalized understanding and agreed to receive care from me.

## 2021-10-20 NOTE — Patient Instructions (Signed)
Dear Haley Perkins,     It was a pleasure to care for you today. Please review your plan of care for this injury:    Work Status     Modified Duty, Restrictions as follows:  Sit down duty only       Medications: Take/Continue as previously directed   Naproxen 500mg  (NSAID - Non-steroidal Anti-Inflammatory Drug) twice daily by mouth with food and hydration as directed as needed for pain and inflammation. OTC. STOP ALL OTHER USE OF ANTI-INFLAMMATORY MEDICATION.   Prilosec 20mg  by mouth once daily for any stomach pain/nausea/upset related to NSAID use  Voltaren Gel 1% (OTC) Apply a small amount topically to affected area 4 times daily    Ice/Heat - as previously directed for discomfort and pain - Alternate ice and heat, applying each for 10-20 minutes 3 times daily on the painful area. Do not apply ice directly to skin as this may cause a burn. Place in a pillow case or towel before putting directly on your skin.     Gentle Home Stretches and Exercises as previously directed to maintain range of motion and relieve discomfort, stiffness, and tension     Imaging Requested: Advised to walk-in today to [facility] at [address], 2nd floor Radiology department to have this completed. The order has already been entered in Apex/Epic.  Xray Ribs ordered on 10/20/2021, pending result    Physical Therapy - 1st set of 6 sessions requested on 10/20/2021 for left side rib cage   [facility] Therapy & Rehab - Physical Therapy   [facility] In-Network Provider)  [address]  [place], [place]  Phone: [phone]  Fax: [phone]     Follow up with Occupational Health on 10/31/2021, sooner and in person as needed for any new onset or worsening symptoms.    Call [phone] to speak with our administrative staff and schedule your appointment on or just before the date above.    Discussed ED red flags and return precautions and patient expressed understanding. All questions were  answered. The patient verbally expressed understanding and agreement with the treatment plan above.    Please call [phone] to sign up for MyChart and the ability to get test results and make appointments online as well as directly send me messages. This is NOT to be used for urgent matters. If an urgent matters does arise, you MUST call the clinic or 911 (for emergencies). Please know that it may be take a couple days to get a response when you directly message me and other providers. We do our best to address your issues as quickly as we can.    If you have simple clinical questions, you may contact me through MyChart. However, it would be best if you schedule a follow-up appointment with me as it will allocate time for me to review your medical record, relevant diagnostic studies and answer your questions.       Due to legal reasons, please do not email me via [place] email because I will not be able to reply to you regarding your protected health information    _______________________________________________________________   Important phone numbers related to your Worker's Compensation Claim:     In the state of [phone], the 1st 3 days of work missed due to a work injury is covered by personal sick time. After that, the worker's comp department typically tries to get your work restrictions accommodated on your department or  in another location. If they are unable to accommodate your restrictions, then the worker's comp department pays an amount comparable to what disability would pay - a percentage of your normal paycheck. I hope this information is helpful.     If you have further questions about payment, work accommodation, and the whole process - please do reach out to either [email or web address] or [email or web address] in our Medical Center Work Comp department. If you are a campus employee, please contact [email or web address]. They are a wonderful resource and should be able to help you.      [place] CMS-Worker's [employer]  Phone Number: [phone]     [employer]-Therapy Referral Company  Phone Number: [phone]     [employer]--Worker's Compensation Pharmacy Coverage  Phone Number: [phone]    [employer] - Worker's Compensation Prescription Drug Program  *The pharmacy should not bill your private insurance for medications we prescribe*    Patients - Call [phone] to find a pharmacy in network with this plan.    [facility] for MRI and NCS/EMG testing  Phone: [phone]     [facility] National for durable medical equipment  [phone]  [phone]    [place]-Worker's Compensation Pharmacy Coverage  Phone Number: [phone]       [facility] Radiology Locations     [facility]  [address] [place] [address]  Phone [phone],   Hours: Monday-Friday, 7:30 a.m.-5:30 p.m    [place]  [address], Third Floor, [address]  [place], [place] [address]  Phone: [phone]  Hours: Monday to Friday 7:30 a.m. - 5:30 p.m.]    [facility]   [address] & [phone] [address]  [place], [place] [address]  [phone]  Hours: Monday to Friday 8:00am-5:00pm [place] [facility] Walk-In Locations     [facility]  [address] 1st floor  [place], [place] [address]  P [phone]  M-F 7:00am-5:30pm     - [facility]  [address]  [place] [address]  P [phone]  M-F 7:30am-6:30pm    [facility]  [facility] Building  [address], 3rd floor [address]  [place], [place] [address]  P [phone]  M-F 7:00am-5:30pm

## 2021-10-31 ENCOUNTER — Ambulatory Visit: Admit: 2021-10-31 | Payer: PRIVATE HEALTH INSURANCE | Attending: Family

## 2021-10-31 DIAGNOSIS — R0781 Pleurodynia: Secondary | ICD-10-CM

## 2021-10-31 NOTE — Patient Instructions (Signed)
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 Please review your plan of care for this injury:    Work Status     Modified Duty, Restrictions as follows:   Sit down duty only       Medications: Take/Continue as previously directed   Naproxen 500mg  (NSAID - Non-steroidal Anti-Inflammatory Drug) twice daily by mouth with food and hydration as directed as needed for pain and inflammation. OTC. STOP ALL OTHER USE OF ANTI-INFLAMMATORY MEDICATION.   Prilosec 20mg  by mouth once daily for any stomach pain/nausea/upset related to NSAID use  Voltaren Gel 1% (OTC) Apply a small amount topically to affected area 4 times daily    Ice/Heat - as previously directed for discomfort and pain - Alternate ice and heat, applying each for 10-20 minutes 3 times daily on the painful area. Do not apply ice directly to skin as this may cause a burn. Place in a pillow case or towel before putting directly on your skin.     Gentle Home Stretches and Exercises as previously directed to maintain range of motion and relieve discomfort, stiffness, and tension     Imaging Requested: Advised to walk-in today to [facility] at [address], 2nd floor Radiology department to have this completed. The order has already been entered in Apex/Epic.  Xray Ribs ordered on 10/20/2021, reviewed result with pt     Physical Therapy - 1st set of 6 sessions requested on 10/20/2021 for left side rib cage   [facility] Therapy & Rehab - Physical Therapy   [facility] In-Network Provider)  [address]  [place], [place]  Phone: [phone]  Fax: [phone]     Follow up with Occupational Health on 12/03/2021, sooner and in person as needed for any new onset or worsening symptoms.    Call [phone] to speak with our administrative staff and schedule your appointment on or just before the date above.    Discussed ED red flags and return precautions and patient expressed understanding. All  questions were answered. The patient verbally expressed understanding and agreement with the treatment plan above.    Please call [phone] to sign up for MyChart and the ability to get test results and make appointments online as well as directly send me messages. This is NOT to be used for urgent matters. If an urgent matters does arise, you MUST call the clinic or 911 (for emergencies). Please know that it may be take a couple days to get a response when you directly message me and other providers. We do our best to address your issues as quickly as we can.     Due to legal reasons, please do not email me via [place] email because I will not be able to reply to you regarding your protected health information    _______________________________________________________________   Important phone numbers related to your Worker's Compensation Claim:     In the state of [phone], the 1st 3 days of work missed due to a work injury is covered by personal sick time. After that, the worker's comp department typically tries to get your work restrictions accommodated on your department or in another location. If they are unable to accommodate your restrictions, then the worker's comp department pays an amount comparable to what disability would pay - a percentage of your normal paycheck. I hope this information is helpful.     If you have  further questions about payment, work accommodation, and the whole process - please do reach out to either [email or web address] or [email or web address] in our Medical Center Work Comp department. If you are a campus employee, please contact [email or web address]. They are a wonderful resource and should be able to help you.    [place] CMS-Worker's [employer]  Phone Number: [phone]     [employer]-Therapy Referral Company  Phone Number: [phone]     [employer]--Worker's Compensation Pharmacy Coverage  Phone Number: [phone]    [employer] - Worker's Compensation  Prescription Drug Program  *The pharmacy should not bill your private insurance for medications we prescribe*    Patients - Call [phone] to find a pharmacy in network with this plan.    [facility] for MRI and NCS/EMG testing  Phone: [phone]     [facility] National for durable medical equipment  [phone]  [phone]    [place]-Worker's Compensation Pharmacy Coverage  Phone Number: [phone]

## 2021-10-31 NOTE — Progress Notes (Unsigned)
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  Concurrent Employment: No other employment   Soc Hx: Lives in [place] with husband. Likes to rest, and some yoga.         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   On exam TTP on lower left costal rib,   Requesting XR   SalonPas continue.   Discussed NSAIDs,    Requested PT.       Initial HPI on 10/20/2021: On 10/03/2021 was caring for pt and given instructions, and when reached out for the chart at the end of the bed, she  pressed against metal rails of gurney, and a rib  rolled over the rail. Instant 8/10 pain on the side, used ice pack. NOW "much better" "definitely" Feels 80% improved, intermittent 3/10 pain with certain position, with sudden move to the R side, or with touch. No pain with walking, or breathing. Improved by: rest. MEDs: SalonPas, 2x/wk.  Sleeping: well, not interrupted by pain. Trying to sleep on the right side. Waking up: without pain or stiffness.  Denies numbness, tingling or weakness, swelling or redness.      MOI: Pressed rib cage against gurney rails   Prior Work Claims: Denies  Contributory Non-Industrial Injury: No prior injury. No MVA.   Alleviating Factors: rest  Exacerbating Factors: certain position, with sudden move to the R side, or with touch  Prior care or tx by PCP or ED:  YES.    PCP phone appointment on 10/07/2021, and completed an XR, and not fractures notes. Advised to continue with SalonPas, and Ibuprofen (NSAIDs).    Able to maintain ADLs (cook/ clean/ wash self/  dress self):  YES, at baseline  Drives: Doesn't drive (usually)      Medications Taking:  Ibuprofen (NSAIDs)  Levothyroxine   Eletriptan   Atenalol (unsure the reason why taken)  Amitriptyline  Multivitamins   Vit-C     Reports PMHx: Hypothyroidism. Migraines Insomnia.   Denies Hx: Heart Murmur, HTN, Heart condition/surgery, Asthma or pulmonary diagnosis, Allergies, GI Hx (Ulcer/Gastritis/GERD), Diabetes, Cancer, or Surgery      ROS at Initial Encounter on 10/20/2021    Constitutional: denies fevers, chills, general malaise   Skin: denies rashes or itching   HENT:denies hearing loss, ear pain, congestion or sore throat  Eyes: denies vision changes or eye pain   Cardiovascular: denies chest pain, pressure or tightness. Denies palpitations or LE swelling  Respiratory: denies cough, hemoptysis, shortness of breath or wheezing   Gastrointestinal: denies abdominal pain, nausea, vomiting, or blood in the stool  Genitourinary: denies dysuria, flank pain or frequency.   Musculoskeletal: see above.   Hematology/Endocrine: denies easy bruise/bleed, polydipsia, unexpected weight loss/gain   Neurological: denies generalized or focal weakness, tingling and numbness or sensory change   Psychiatric: denies anxiety, hallucinations or SI/HI    OBJECTIVE  There were no vitals filed for  this visit.     No LMP recorded. Patient has had a hysterectomy.    Physical Exam  Constitutional:       Appearance: Normal appearance.   HENT:      Head: Normocephalic.   Pulmonary:      Effort: Pulmonary effort is normal.      Breath sounds: Normal breath sounds.   Abdominal:      General: Abdomen is flat.      Palpations: Abdomen is soft.          Comments: TTP on left lower costal rib. NO swelling, redness or erythema noted.    Musculoskeletal:         General: Normal range of motion.   Skin:     General: Skin is warm and dry.   Neurological:      Mental Status: She is alert.   Psychiatric:         Mood and Affect: Mood normal.         Behavior:  Behavior normal.       XR RIBS, LEFT   10/20/2021 2:01 PM ***  CLINICAL HISTORY provided by referring provider: Pain on lower left rib cage after pressing agaist metal siderails. Thank you.  COMPARISON: None  IMPRESSION:   Normal cardiac and mediastinal silhouettes.  Lungs appear clear. No displaced rib fracture.  Report dictated by: Dorena Bodo, MD, signed by: Dorena Bodo, MD      ASSESSMENT  {No diagnosis found. (Refresh or delete this SmartLink)}        PLAN    Plan Note initial 10/20/2021: Presentation consistent with Industrial injury. Left side of lower rib cage pressed against gurney side rails. Feels now 80% improved. Had a visit with PCP who ordered an XR, no fractures noted. On exam TTP on lower left costal rib, otherwise PE wnl. Requesting XR as report is not available to this provider. SalonPas continue. Discussed NSAIDs, with food and hydration. Requested PT. Discussed ED precautions. WS. Modified Duty. Follow up with Occupational Health on 10/31/2021.   Plan Note ***:          Work Status     Modified Duty, Restrictions as follows: ***  Sit down duty only       Medications: Take/Continue as previously directed   Naproxen 500mg  (NSAID - Non-steroidal Anti-Inflammatory Drug) twice daily by mouth with food and hydration as directed as needed for pain and inflammation. OTC. STOP ALL OTHER USE OF ANTI-INFLAMMATORY MEDICATION.   Prilosec 20mg  by mouth once daily for any stomach pain/nausea/upset related to NSAID use  Voltaren Gel 1% (OTC) Apply a small amount topically to affected area 4 times daily    Ice/Heat - as previously directed for discomfort and pain - Alternate ice and heat, applying each for 10-20 minutes 3 times daily on the painful area. Do not apply ice directly to skin as this may cause a burn. Place in a pillow case or towel before putting directly on your skin.     Gentle Home Stretches and Exercises as previously directed to maintain range of motion and relieve discomfort,  stiffness, and tension     Imaging Requested: Advised to walk-in today to [facility] at [address], 2nd floor Radiology department to have this completed. The order has already been entered in [employer].  Xray Ribs ordered on 10/20/2021, pending result    Physical Therapy - 1st set of 6 sessions requested on 10/20/2021 for left side rib cage  [facility] - Physical Therapy   [facility] In-Network Provider)  [address]  [place], [place] [address]  Phone: [phone]  Fax: [phone]     Follow up with Occupational Health on 10/31/2021 ***, sooner and in person as needed for any new onset or worsening symptoms.    Call [phone] to speak with our administrative staff and schedule your appointment on or just before the date above.    Discussed ED red flags and return precautions and patient expressed understanding. All questions were answered. The patient verbally expressed understanding and agreement with the treatment plan above.    Please call [phone] to sign up for MyChart and the ability to get test results and make appointments online as well as directly send me messages. This is NOT to be used for urgent matters. If an urgent matters does arise, you MUST call the clinic or 911 (for emergencies). Please know that it may be take a couple days to get a response when you directly message me and other providers. We do our best to address your issues as quickly as we can.    NOTE:     This is an independent service.   The available consultant for this service is Murrell Converse, MD.             Cherlynn Polo, FNP-BC  [facility]

## 2021-12-03 ENCOUNTER — Ambulatory Visit: Admit: 2021-12-03 | Payer: PRIVATE HEALTH INSURANCE | Attending: Family

## 2021-12-03 DIAGNOSIS — R0781 Pleurodynia: Secondary | ICD-10-CM

## 2021-12-03 NOTE — Progress Notes (Signed)
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  Concurrent Employment: No other employment   Soc Hx: Lives in [place] with husband. Likes to rest, and some yoga.         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  Last PT session was yesterday, and discharged from PT. Regular activities at home such as cleaning, cooking, grocery shopping without discomfort. MEDs: none. Sleeping: well, not interrupted by pain. Waking up: without pain or stiffness. Waking up: without pain or stiffness. Denies numbness, tingling or weakness, swelling or redness. Denies CP, palpitations, HA's or shortness of breath. Feels 100% improved.      10/31/2021: Started PT this wk, given exercises for home, PT helps with rib cage pain. Sx improved compared to last visit, intermittent 3/10 pain with ROM. Otherwise no pain. No other sx, no issues with deep or regular breathing. MEDs: Ibuprofen (NSAIDs) 600mg , last tab yesterday. SalonPas helps with sx. Feels now 80-90% improved. Denies numbness, tingling or weakness, swelling or redness.       Initial HPI on 10/20/2021: On 10/03/2021 was caring for pt and given instructions, and when reached out for the chart at the end of the bed, she  pressed against metal rails of gurney, and a rib  rolled over the rail. Instant 8/10 pain on the side, used ice pack. NOW "much better" "definitely" Feels 80% improved, intermittent 3/10 pain with certain position, with sudden move to the R side, or with touch. No pain with  walking, or breathing. Improved by: rest. MEDs: SalonPas, 2x/wk.  Sleeping: well, not interrupted by pain. Trying to sleep on the right side. Waking up: without pain or stiffness.  Denies numbness, tingling or weakness, swelling or redness.      MOI: Pressed rib cage against gurney rails   Prior Work Claims: Denies  Contributory Non-Industrial Injury: No prior injury. No MVA.   Alleviating Factors: rest  Exacerbating Factors: certain position, with sudden move to the R side, or with touch  Prior care or tx by PCP or ED:  YES.    PCP phone appointment on 10/07/2021, and completed an XR, and not fractures notes. Advised to continue with SalonPas, and Ibuprofen (NSAIDs).    Able to maintain ADLs (cook/ clean/ wash self/ dress self):  YES, at baseline  Drives: Doesn't drive (usually)      Medications Taking:  Ibuprofen (NSAIDs)  Levothyroxine   Eletriptan   Atenalol (unsure the reason why taken)  Amitriptyline  Multivitamins   Vit-C     Reports PMHx: Hypothyroidism. Migraines Insomnia.   Denies Hx: Heart Murmur, HTN, Heart condition/surgery, Asthma or pulmonary diagnosis, Allergies, GI Hx (Ulcer/Gastritis/GERD), Diabetes, Cancer, or Surgery      ROS at Initial Encounter on 10/20/2021    Constitutional: denies fevers, chills, general malaise   Skin: denies rashes or itching   HENT:denies hearing loss, ear pain, congestion or  sore throat  Eyes: denies vision changes or eye pain   Cardiovascular: denies chest pain, pressure or tightness. Denies palpitations or LE swelling  Respiratory: denies cough, hemoptysis, shortness of breath or wheezing   Gastrointestinal: denies abdominal pain, nausea, vomiting, or blood in the stool  Genitourinary: denies dysuria, flank pain or frequency.   Musculoskeletal: see above.   Hematology/Endocrine: denies easy bruise/bleed, polydipsia, unexpected weight loss/gain   Neurological: denies generalized or focal weakness, tingling and numbness or sensory change   Psychiatric: denies anxiety,  hallucinations or SI/HI    OBJECTIVE  There were no vitals filed for this visit.     No LMP recorded. Patient has had a hysterectomy.    Physical Exam  Constitutional:       General: She is not in acute distress.     Appearance: Normal appearance. She is not ill-appearing.   HENT:      Head: Normocephalic and atraumatic.   Pulmonary:      Effort: Pulmonary effort is normal.   Neurological:      Mental Status: She is alert.   Psychiatric:         Mood and Affect: Mood normal.         Behavior: Behavior normal. Behavior is cooperative.         Judgment: Judgment normal.       XR RIBS, LEFT   10/20/2021 2:01 PM   CLINICAL HISTORY provided by referring provider: Pain on lower left rib cage after pressing agaist metal siderails. Thank you.  COMPARISON: None  IMPRESSION:   Normal cardiac and mediastinal silhouettes.  Lungs appear clear. No displaced rib fracture.  Report dictated by: Dorena Bodo, MD, signed by: Dorena Bodo, MD      ASSESSMENT  Visit Diagnoses     ICD-10-CM    1. Costal margin pain  R07.81             PLAN    Plan Note initial 10/20/2021: Presentation consistent with Industrial injury. Left side of lower rib cage pressed against gurney side rails. Feels now 80% improved. Had a visit with PCP who ordered an XR, no fractures noted. On exam TTP on lower left costal rib, otherwise PE wnl. Requesting XR as report is not available to this provider. SalonPas continue. Discussed NSAIDs, with food and hydration. Requested PT. Discussed ED precautions. WS. Modified Duty. Follow up with Occupational Health on 10/31/2021.   Plan Note 10/31/2021: Sx  improved compared to last visit with PT. Pt in progress, continue. Current sx:  intermittent 3/10 pain with ROM. Feels 80-90% improved. No issues with breathing. Discussed XR ribs result, wnl. WS. Modified Duty unchanged. Follow up with Occupational Health on 12/03/2021.    Plan Note 12/03/2021: VV.  Asymptomatic. All sx resolved last wk. PT completed and  discharged from PT. Regular activities at home without discomfort. Not on pain meds. No CP, palpitations or shortness of breath.  Feels 100% improved. WS. Trial FD. Follow up with Occupational Health on 12/25/2021.         Work Status     Trial Full Duty       Medications: Take/Continue as previously directed   Naproxen 500mg  (NSAID - Non-steroidal Anti-Inflammatory Drug) twice daily by mouth with food and hydration as directed as needed for pain and inflammation. OTC. STOP ALL OTHER USE OF ANTI-INFLAMMATORY MEDICATION.   Prilosec 20mg  by mouth once daily for any stomach pain/nausea/upset related to NSAID use  Voltaren Gel 1% (OTC) Apply  a small amount topically to affected area 4 times daily    Ice/Heat - as previously directed for discomfort and pain - Alternate ice and heat, applying each for 10-20 minutes 3 times daily on the painful area. Do not apply ice directly to skin as this may cause a burn. Place in a pillow case or towel before putting directly on your skin.     Gentle Home Stretches and Exercises as previously directed to maintain range of motion and relieve discomfort, stiffness, and tension     Imaging Requested: Advised to walk-in today to [facility] at [address], 2nd floor Radiology department to have this completed. The order has already been entered in [employer].  Xray Ribs ordered on 10/20/2021, reviewed result with pt     Physical Therapy - 1st set of 6 sessions requested on 10/20/2021 for left side rib cage   [facility] - Physical Therapy   [facility] In-Network Provider)  [address]  [place], [place] [address]  Phone: [phone]  Fax: [phone]     Follow up with Occupational Health on 12/25/2021, sooner and in person as needed for any new onset or worsening symptoms.    Call [phone] to speak with our administrative staff and schedule your appointment on or just before the date above.    Discussed ED red flags and return  precautions and patient expressed understanding. All questions were answered. The patient verbally expressed understanding and agreement with the treatment plan above.    Please call [phone] to sign up for MyChart and the ability to get test results and make appointments online as well as directly send me messages. This is NOT to be used for urgent matters. If an urgent matters does arise, you MUST call the clinic or 911 (for emergencies). Please know that it may be take a couple days to get a response when you directly message me and other providers. We do our best to address your issues as quickly as we can.    NOTE:     This is an independent service.   The available consultant for this service is Murrell Converse, MD.             Cherlynn Polo, FNP-BC  [facility]    I performed this consultation using real-time Telehealth tools, including a live video connection between my location and the patient's location. Prior to initiating the consultation, I obtained informed verbal consent to perform this consultation using Telehealth tools and answered all the questions about the Telehealth interaction.

## 2021-12-03 NOTE — Patient Instructions (Signed)
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 Please review your plan of care for this injury:    Work Status     Trial Full Duty       Medications: Take/Continue as previously directed   Naproxen 500mg  (NSAID - Non-steroidal Anti-Inflammatory Drug) twice daily by mouth with food and hydration as directed as needed for pain and inflammation. OTC. STOP ALL OTHER USE OF ANTI-INFLAMMATORY MEDICATION.   Prilosec 20mg  by mouth once daily for any stomach pain/nausea/upset related to NSAID use  Voltaren Gel 1% (OTC) Apply a small amount topically to affected area 4 times daily    Ice/Heat - as previously directed for discomfort and pain - Alternate ice and heat, applying each for 10-20 minutes 3 times daily on the painful area. Do not apply ice directly to skin as this may cause a burn. Place in a pillow case or towel before putting directly on your skin.     Gentle Home Stretches and Exercises as previously directed to maintain range of motion and relieve discomfort, stiffness, and tension     Imaging Requested: Advised to walk-in today to [facility] at [address], 2nd floor Radiology department to have this completed. The order has already been entered in [employer]/Epic.  Xray Ribs ordered on 10/20/2021, reviewed result with pt     Physical Therapy - 1st set of 6 sessions requested on 10/20/2021 for left side rib cage   [facility] - Physical Therapy   [facility] In-Network Provider)  [address]  [place], [place]  Phone: [phone]  Fax: [phone]     Follow up with Occupational Health on 12/25/2021, sooner and in person as needed for any new onset or worsening symptoms.    Call [phone] to speak with our administrative staff and schedule your appointment on or just before the date above.    Discussed ED red flags and return precautions and patient expressed understanding. All questions were answered. The patient verbally  expressed understanding and agreement with the treatment plan above.    Please call [phone] to sign up for MyChart and the ability to get test results and make appointments online as well as directly send me messages. This is NOT to be used for urgent matters. If an urgent matters does arise, you MUST call the clinic or 911 (for emergencies). Please know that it may be take a couple days to get a response when you directly message me and other providers. We do our best to address your issues as quickly as we can.     Due to legal reasons, please do not email me via [facility] email because I will not be able to reply to you regarding your protected health information    _______________________________________________________________   Important phone numbers related to your Worker's Compensation Claim:     In the state of [phone], the 1st 3 days of work missed due to a work injury is covered by personal sick time. After that, the worker's comp department typically tries to get your work restrictions accommodated on your department or in another location. If they are unable to accommodate your restrictions, then the worker's comp department pays an amount comparable to what disability would pay - a percentage of your normal paycheck. I hope this information is helpful.     If you have further questions about payment, work accommodation, and the  whole process - please do reach out to either [email or web address] or [email or web address] in our Medical Center Work Comp department. If you are a campus employee, please contact [email or web address]. They are a wonderful resource and should be able to help you.    [place] [employer]-Worker's [employer]  Phone Number: [phone]     [employer]  Phone Number: [phone]     [employer]--Worker's Compensation Pharmacy Coverage  Phone Number: [phone]    [employer] - Worker's Compensation Prescription Drug Program  *The pharmacy should  not bill your private insurance for medications we prescribe*    Patients - Call [phone] to find a pharmacy in network with this plan.    [facility] for MRI and NCS/EMG testing  Phone: [phone]     [facility] [employer] for durable medical equipment  [phone]  [phone] x [phone]    [place]-Worker's Compensation Pharmacy Coverage  Phone Number: [phone]

## 2021-12-25 ENCOUNTER — Ambulatory Visit: Admit: 2021-12-25 | Payer: PRIVATE HEALTH INSURANCE | Attending: Family

## 2021-12-25 DIAGNOSIS — R0781 Pleurodynia: Secondary | ICD-10-CM

## 2021-12-25 NOTE — Progress Notes (Unsigned)
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  Concurrent Employment: No other employment   Soc Hx: Lives in [place] with husband. Likes to rest, and some yoga.         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   All sx resolved last wk.   PT completed and discharged from PT.   Regular activities at home without discomfort.   Not on pain meds.   Feels 100% improved.   WS. Trial FD.             12/03/2021: Sx improved 2 wk ago, and asymptomatic since last wk.  Last PT session was yesterday, and discharged from PT. Regular activities at home such as cleaning, cooking, grocery shopping without discomfort. MEDs: none. Sleeping: well, not interrupted by pain. Waking up: without pain or stiffness. Waking up: without pain or stiffness. Denies numbness, tingling or weakness, swelling or redness. Denies CP, palpitations, HA's or shortness of breath. Feels 100% improved.      10/31/2021: Started PT this wk, given exercises for home, PT helps with rib cage pain. Sx improved compared to last visit, intermittent 3/10 pain with ROM. Otherwise no pain. No other sx, no issues with deep or regular breathing. MEDs: Ibuprofen (NSAIDs) 600mg , last tab yesterday. SalonPas helps with sx. Feels now 80-90% improved. Denies numbness, tingling or weakness, swelling or redness.       Initial HPI on 10/20/2021: On 10/03/2021 was caring for pt and given instructions, and when reached out for the chart at the end of the bed, she  pressed against metal rails of gurney, and a rib   rolled over the rail. Instant 8/10 pain on the side, used ice pack. NOW "much better" "definitely" Feels 80% improved, intermittent 3/10 pain with certain position, with sudden move to the R side, or with touch. No pain with walking, or breathing. Improved by: rest. MEDs: SalonPas, 2x/wk.  Sleeping: well, not interrupted by pain. Trying to sleep on the right side. Waking up: without pain or stiffness.  Denies numbness, tingling or weakness, swelling or redness.      MOI: Pressed rib cage against gurney rails   Prior Work Claims: Denies  Contributory Non-Industrial Injury: No prior injury. No MVA.   Alleviating Factors: rest  Exacerbating Factors: certain position, with sudden move to the R side, or with touch  Prior care or tx by PCP or ED:  YES.    PCP phone appointment on 10/07/2021, and completed an XR, and not fractures notes. Advised to continue with SalonPas, and Ibuprofen (NSAIDs).    Able to maintain ADLs (cook/ clean/ wash self/ dress self):  YES, at baseline  Drives: Doesn't drive (usually)      Medications Taking:  Ibuprofen (NSAIDs)  Levothyroxine   Eletriptan   Atenalol (unsure the reason why taken)  Amitriptyline  Multivitamins   Vit-C  Reports PMHx: Hypothyroidism. Migraines Insomnia.   Denies Hx: Heart Murmur, HTN, Heart condition/surgery, Asthma or pulmonary diagnosis, Allergies, GI Hx (Ulcer/Gastritis/GERD), Diabetes, Cancer, or Surgery      ROS at Initial Encounter on 10/20/2021    Constitutional: denies fevers, chills, general malaise   Skin: denies rashes or itching   HENT:denies hearing loss, ear pain, congestion or sore throat  Eyes: denies vision changes or eye pain   Cardiovascular: denies chest pain, pressure or tightness. Denies palpitations or LE swelling  Respiratory: denies cough, hemoptysis, shortness of breath or wheezing   Gastrointestinal: denies abdominal pain, nausea, vomiting, or blood in the stool  Genitourinary: denies dysuria, flank pain or frequency.   Musculoskeletal: see  above.   Hematology/Endocrine: denies easy bruise/bleed, polydipsia, unexpected weight loss/gain   Neurological: denies generalized or focal weakness, tingling and numbness or sensory change   Psychiatric: denies anxiety, hallucinations or SI/HI    OBJECTIVE  There were no vitals filed for this visit.     No LMP recorded. Patient has had a hysterectomy.    Physical Exam  Constitutional:       General: She is not in acute distress.     Appearance: Normal appearance. She is not ill-appearing.   HENT:      Head: Normocephalic and atraumatic.   Pulmonary:      Effort: Pulmonary effort is normal.   Neurological:      Mental Status: She is alert.   Psychiatric:         Mood and Affect: Mood normal.         Behavior: Behavior normal. Behavior is cooperative.         Judgment: Judgment normal.       XR RIBS, LEFT   10/20/2021 2:01 PM   CLINICAL HISTORY provided by referring provider: Pain on lower left rib cage after pressing agaist metal siderails. Thank you.  COMPARISON: None  IMPRESSION:   Normal cardiac and mediastinal silhouettes.  Lungs appear clear. No displaced rib fracture.  Report dictated by: Dorena Bodo, MD, signed by: Dorena Bodo, MD      ASSESSMENT  {No diagnosis found. (Refresh or delete this SmartLink)}        PLAN    Plan Note initial 10/20/2021: Presentation consistent with Industrial injury. Left side of lower rib cage pressed against gurney side rails. Feels now 80% improved. Had a visit with PCP who ordered an XR, no fractures noted. On exam TTP on lower left costal rib, otherwise PE wnl. Requesting XR as report is not available to this provider. SalonPas continue. Discussed NSAIDs, with food and hydration. Requested PT. Discussed ED precautions. WS. Modified Duty. Follow up with Occupational Health on 10/31/2021.   Plan Note 10/31/2021: Sx  improved compared to last visit with PT. Pt in progress, continue. Current sx:  intermittent 3/10 pain with ROM. Feels 80-90% improved. No issues  with breathing. Discussed XR ribs result, wnl. WS. Modified Duty unchanged. Follow up with Occupational Health on 12/03/2021.    Plan Note 12/03/2021: VV.  Asymptomatic. All sx resolved last wk. PT completed and discharged from PT. Regular activities at home without discomfort. Not on pain meds. No CP, palpitations or shortness of breath.  Feels 100% improved. WS. Trial FD. Follow up with Occupational Health on 12/25/2021.   Plan Note ***:            Work Status     Trial Full Duty ***      Medications: Take/Continue as  previously directed   Naproxen 500mg  (NSAID - Non-steroidal Anti-Inflammatory Drug) twice daily by mouth with food and hydration as directed as needed for pain and inflammation. OTC. STOP ALL OTHER USE OF ANTI-INFLAMMATORY MEDICATION.   Prilosec 20mg  by mouth once daily for any stomach pain/nausea/upset related to NSAID use  Voltaren Gel 1% (OTC) Apply a small amount topically to affected area 4 times daily    Ice/Heat - as previously directed for discomfort and pain - Alternate ice and heat, applying each for 10-20 minutes 3 times daily on the painful area. Do not apply ice directly to skin as this may cause a burn. Place in a pillow case or towel before putting directly on your skin.     Gentle Home Stretches and Exercises as previously directed to maintain range of motion and relieve discomfort, stiffness, and tension     Imaging Requested: Advised to walk-in today to [facility] at [address], 2nd floor Radiology department to have this completed. The order has already been entered in [employer].  Xray Ribs ordered on 10/20/2021, reviewed result with pt     Physical Therapy - 1st set of 6 sessions requested on 10/20/2021 for left side rib cage   [facility] Therapy [facility] Re[facility] - Physical Therapy   [facility] In-Network Provider)  [address]  [place], [place] [address]  Phone: [phone]  Fax: [phone]     Follow up with Occupational Health on 12/25/2021 ***,  sooner and in person as needed for any new onset or worsening symptoms.    Call [phone] to speak with our administrative staff and schedule your appointment on or just before the date above.    Discussed ED red flags and return precautions and patient expressed understanding. All questions were answered. The patient verbally expressed understanding and agreement with the treatment plan above.    Please call [phone] to sign up for MyChart and the ability to get test results and make appointments online as well as directly send me messages. This is NOT to be used for urgent matters. If an urgent matters does arise, you MUST call the clinic or 911 (for emergencies). Please know that it may be take a couple days to get a response when you directly message me and other providers. We do our best to address your issues as quickly as we can.    NOTE:     This is an independent service.   The available consultant for this service is Murrell Converse, MD.             Cherlynn Polo, FNP-BC  [facility]    I performed this consultation using real-time Telehealth tools, including a live video connection between my location and the patient's location. Prior to initiating the consultation, I obtained informed verbal consent to perform this consultation using Telehealth tools and answered all the questions about the Telehealth interaction.

## 2021-12-25 NOTE — Patient Instructions (Signed)
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 Please review your plan of care for this injury:    Work Status     Full Duty       Medications:   None        Imaging Requested: Advised to walk-in today to [facility] at [address], 2nd floor Radiology department to have this completed. The order has already been entered in [employer]/Epic.  Xray Ribs ordered on 10/20/2021, reviewed result with pt     Physical Therapy - 1st set of 6 sessions requested on 10/20/2021 for left side rib cage   [facility] - Physical Therapy   [facility] In-Network Provider)  [address]  [place], [place] [address]  Phone: [phone]  Fax: [phone]     Discharged from Occupational Health as of today, 12/25/2021. Follow up with the adjuster for any future question regarding this claim.     Discussed ED red flags and return precautions and patient expressed understanding. All questions were answered. The patient verbally expressed understanding and agreement with the treatment plan above.    Please call [phone] to sign up for MyChart and the ability to get test results and make appointments online as well as directly send me messages. This is NOT to be used for urgent matters. If an urgent matters does arise, you MUST call the clinic or 911 (for emergencies). Please know that it may be take a couple days to get a response when you directly message me and other providers. We do our best to address your issues as quickly as we can.     Due to legal reasons, please do not email me via [place] email because I will not be able to reply to you regarding your protected health information    _______________________________________________________________   Important phone numbers related to your Worker's Compensation Claim:     In the state of [place], the 1st 3 days of work missed due to a work injury is covered by personal sick time. After that, the worker's comp  department typically tries to get your work restrictions accommodated on your department or in another location. If they are unable to accommodate your restrictions, then the worker's comp department pays an amount comparable to what disability would pay - a percentage of your normal paycheck. I hope this information is helpful.     If you have further questions about payment, work accommodation, and the whole process - please do reach out to either [email or web address] or [email or web address] in our Medical Center Work Comp department. If you are a campus employee, please contact [email or web address]. They are a wonderful resource and should be able to help you.    [place] CMS-Worker's [employer]  Phone Number: [phone]     [employer]-Therapy [employer]erral [employer]  Phone Number: [phone]     [employer]--Worker's Compensation Pharmacy Coverage  Phone Number: [phone]    [employer] - Worker's Compensation Prescription Drug Program  *The pharmacy should not bill your private insurance for medications we prescribe*    Patients - Call [phone] to find a pharmacy in network with this plan.    [facility] [facility] for MRI and NCS/EMG testing  Phone: [phone]     [facility] [employer] for durable medical equipment  [phone]  [phone]    [place]-Worker's Compensation Pharmacy Coverage  Phone Number: [phone]

## 2022-01-30 NOTE — Progress Notes (Unsigned)
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 ***   Concurrent Employment: No other employment   Soc Hx: Lives in [place] with husband. Likes to rest, and some yoga. ***       SUBJECTIVE  CC/HPI: Eligible for Artist program per [employer] (program coordinator). Presenting today for medical hx review and determination of recommendation for program. ***    Allergies as of 01/26/2022  Review status set to Review Complete by Johnsie Cancel on 12/25/2021   No Known Allergies          Reports PMHx: Hypothyroidism. *** Migraines *** Insomnia. ***   Denies Hx: Heart Murmur, HTN, Heart condition/surgery, Asthma or pulmonary diagnosis, Allergies, GI Hx (Ulcer/Gastritis/GERD), Diabetes, Cancer, or Surgery.      Current Meds: ***  Ibuprofen (NSAIDs)  Levothyroxine   Eletriptan   Atenalol (unsure the reason why taken) ***   Amitriptyline  Multivitamins   Vit-C       Cardiovascular History    Denies:   A heart attack   Heart surgery   Cardiac catheterization   Coronary angioplasty (PTCA)   Pacemaker/implantable cardiac defibrillator/rhythm disturbance   Heart valve disease   Heart failure   Heart transplantation   Congenital heart disease   Heart palpitations   You take heart medications   Denies S/Sx:   Heart murmur   You experience chest discomfort with exertion   You experience unreasonable breathlessness or fatigue with usual activities   You experience dizziness, fainting, blackouts  You have burning or cramping sensation in your lower legs when walking short distances   You have circulatory conditions like ankle swelling   You have ankle swelling not related to musculoskeletal injury   Denies Other Health Concerns:   You had a stroke or have cerebrovascular disease   You have diabetes or other metabolic  disease   Your fasting blood glucose level is equal to or greater than 100 mg   You have asthma or other lung condition/disease   You have a medical diagnosis or disease   Denies:   You have musculoskeletal problems that limit physical activity   You are pregnant   You have concerns about the safety of exercise      Cardiovascular Risk Factors    [] You are a man 68 years of age or older   [] You are a woman 13 years of age or older, have had a hysterectomy, or are postmenopausal   [] You smoke or quit smoking within the previous 6 months   [] Your blood pressure is greater than or equal to 140/90 mmHg or you do not know your blood pressure   [] You take blood pressure medication   [] Your blood cholesterol level is greater than 200 mg/dl or you do not know your cholesterol level   [] You have a close blood relative who had a heart attack before age 46 (father or brother) or age 72 (mother or sister)   [] You are physically inactive (i.e, you get less than 30 minutes of physical activity on at least 3 days per week)   [] You are more than 20 pounds overweight        ROS at Initial Encounter on ***     Constitutional: denies fevers, chills,  general malaise   Skin: denies rashes or itching   HENT:denies hearing loss, ear pain, congestion or sore throat  Eyes: denies vision changes or eye pain   Cardiovascular: denies chest pain, pressure or tightness. Denies palpitations or LE swelling  Respiratory: denies cough, hemoptysis, shortness of breath or wheezing   Gastrointestinal: denies abdominal pain, nausea, vomiting, or blood in the stool  Genitourinary: denies dysuria, flank pain or frequency.   Musculoskeletal: denies falls, myalgias   Hematology/Endocrine: denies easy bruise/bleed, polydipsia, unexpected weight loss/gain   Neurological: denies generalized or focal weakness, tingling and numbness or sensory change   Psychiatric: denies anxiety, hallucinations or SI/HI    OBJECTIVE    Physical Exam      ASSESSMENT  Visit  Diagnoses     ICD-10-CM    1. Encounter for health-related screening  Z13.9        PLAN   Plan Note ***: Recommend/Do Not Recommend  ***for WorkStrong Program. Decision communicated to program coordinator.     This is an independent service.   The available consultant for this service is Gearldine Shown, MD.             Iona Beard, FNP-BC  Nurse Practitioner   [place]

## 2022-02-09 NOTE — Progress Notes (Signed)
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    Concurrent Employment: No other employment     Soc Hx: Lives in [place] with husband. Likes to rest, and some yoga.        SUBJECTIVE  CC/HPI: Presenting today for medical hx review and determination of recommendation for WorkStrong program .     Allergies as of 02/09/2022  Review status set to Review Complete by Johnsie Cancel on 02/09/2022   No Known Allergies          Reports PMHx: Hypothyroidism. Migraines. Insomnia.  Hx of full hysterectomy  in 2013   Denies Hx: Heart Murmur, HTN, Heart condition/surgery, Asthma or pulmonary diagnosis, Allergies, GI Hx (Ulcer/Gastritis/GERD), Diabetes, Cancer     Medications Taking:  Ibuprofen (NSAIDs)  Levothyroxine   Eletriptan for Migraine   Atenalol for Migraine   Amitriptyline for Migraine   Multivitamins   Vit-C       Cardiovascular History    Denies:   A heart attack   Heart surgery   Cardiac catheterization   Coronary angioplasty (PTCA)   Pacemaker/implantable cardiac defibrillator/rhythm disturbance   Heart valve disease   Heart failure   Heart transplantation   Congenital heart disease   Heart palpitations   You take heart medications   Denies S/Sx:   Heart murmur   You experience chest discomfort with exertion   You experience unreasonable breathlessness or fatigue with usual activities   You experience dizziness, fainting, blackouts  You have burning or cramping sensation in your lower legs when walking short distances   You have circulatory conditions like ankle swelling   You have ankle swelling not related to musculoskeletal injury   Denies Other Health Concerns:   You had a stroke or have  cerebrovascular disease   You have diabetes or other metabolic disease   Yes -  fasting blood glucose level is equal to or greater than 100 mg (102 in 11/04/2020)  You have asthma or other lung condition/disease   You have a medical diagnosis or disease   Denies:   You have musculoskeletal problems that limit physical activity   You are pregnant   You have concerns about the safety of exercise      Cardiovascular Risk Factors    [] You are a man 53 years of age or older   [x] You are a woman 55 years of age or older, have had a hysterectomy, or are postmenopausal   [] You smoke or quit smoking within the previous 6 months   [] Your blood pressure is greater than or equal to 140/90 mmHg or you do not know your blood pressure   [x] You take blood pressure medication > Atenolol for Migraines   [x] Your blood cholesterol level is greater than 200 mg/dl or you do not know your cholesterol level   [x] You have a close blood relative who had a heart attack before age 25 (father or brother) or age 31 (mother or sister) > Not sure of grand-parents.   [x] You are physically inactive (i.e, you get less than 30 minutes of physical activity on at least  3 days per week)   [x] You are more than 20 pounds overweight      ROS  GENERAL: Denies fever/chills/nausea/vomiting/ general malaise.    CARDIOVASCULAR: Denies chest pain/ pressure. Denies palpitations.   PULMONARY: Denies SOB/dyspnea/cough/wheezing.   HEMATOLOGICAL: Denies easy bruising/ bleeding.   NEUROLOGIC: Denies headache/convulsion/ change in sensation/ numbness or weakness.      OBJECTIVE  Vitals:    02/09/22 1256   BP: 110/67   Pulse: 60   Temp: 36.6 C (97.9 F)         Physical Exam  Constitutional:       Appearance: Normal appearance.   HENT:      Head: Normocephalic and atraumatic.      Mouth/Throat:      Mouth: Mucous membranes are moist.   Eyes:      Extraocular Movements: Extraocular movements intact.      Pupils: Pupils are equal, round, and reactive to light.    Cardiovascular:      Rate and Rhythm: Normal rate and regular rhythm.      Pulses:           Radial pulses are 2+ on the right side and 2+ on the left side.      Heart sounds: Normal heart sounds.   Pulmonary:      Effort: Pulmonary effort is normal.      Breath sounds: Normal breath sounds.   Abdominal:      General: Abdomen is flat. Bowel sounds are normal.      Palpations: Abdomen is soft.   Musculoskeletal:         General: Normal range of motion.      Cervical back: Normal range of motion.      Right lower leg: No edema.      Left lower leg: No edema.   Skin:     General: Skin is warm.   Neurological:      General: No focal deficit present.      Mental Status: She is alert and oriented to person, place, and time.      Motor: Motor function is intact.      Coordination: Coordination is intact.      Gait: Gait is intact.   Psychiatric:         Mood and Affect: Mood normal.         Behavior: Behavior normal.         Thought Content: Thought content normal.         Judgment: Judgment normal.         ASSESSMENT  Visit Diagnoses     ICD-10-CM    1. Encounter for health-related screening  Z13.9        PLAN  Plan Note 02/09/2022: 30HB patient with PMHx of hypothyroidism, Migraines. Insomnia. Hysterectomy in 2013; and possible family hx or cardiac disease in grandparents. PCP clearance form provided. Recommend for Work [facility] once Medical clearance form received.  Decision communicated to program coordinator, Vladimir Creeks.      This is an independent service.   The available consultant for this service is Gearldine Shown, MD.             Iona Beard, FNP-BC  Nurse Practitioner   [place]

## 2022-09-09 ENCOUNTER — Inpatient Hospital Stay: Admit: 2022-09-09 | Payer: PRIVATE HEALTH INSURANCE

## 2022-09-09 DIAGNOSIS — Z1231 Encounter for screening mammogram for malignant neoplasm of breast: Secondary | ICD-10-CM

## 2023-01-20 ENCOUNTER — Telehealth: Admit: 2023-01-20 | Primary: Student in an Organized Health Care Education/Training Program

## 2023-01-20 DIAGNOSIS — Z7689 Persons encountering health services in other specified circumstances: Secondary | ICD-10-CM

## 2023-01-20 NOTE — Progress Notes (Signed)
Visit Date: 01/20/2023  Return to Work Visit - Non Industrial    Haley Perkins    PTP: Knute Neu. Carlynn Purl MD    Diagnosis given by PTP: laparoscopic cholecystectomy     [facility]- PACU DEPT  Job title/ Employment Length: RN/44yrs    SUBJECTIVE  HPI: off work since 12/23/22, denied work restriction    Medications/Therapies/Interventions Reported:  rest      OBJECTIVE    Limited General Exam  Patient alert and oriented, pleasant, in NAD. Color good, coherent. Respiratory effort WNL. Moving about comfortably.      PLAN    Work Status full duty as of 01/26/23 per treating provider.      Follow up with PTP as scheduled, sooner as needed for any new onset or worsening symptoms. Follow up with Occupational Health for any change in work status - Call [phone] to speak with our administrative staff and schedule the appointment as needed.    Tacey Ruiz RN

## 2023-12-28 ENCOUNTER — Ambulatory Visit: Admit: 2023-12-28

## 2023-12-28 DIAGNOSIS — Z7689 Persons encountering health services in other specified circumstances: Secondary | ICD-10-CM

## 2023-12-28 NOTE — Progress Notes (Signed)
 Visit Date: 12/28/2023  Return to Work Visit - Non Industrial    Haley Perkins    PTP: Dr. 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  Limited General Exam  Patient alert and oriented, pleasant, in NAD. Color good, coherent. Respiratory effort WNL. Moving about comfortably.      ASSESSMENT      Additional Notes:      PLAN    Work Status Return to work 09.08.25-09.22.25  12 hour shift/3 days week and do not carry more than 10 pounds     Follow up with PTP as scheduled, sooner as needed for any new onset or worsening symptoms. Follow up with Occupational Health for any change in work status - Call [phone] to speak with our administrative staff and schedule the appointment as needed.    Ronnald FURY Darral, Glass blower/designer

## 2024-01-12 ENCOUNTER — Ambulatory Visit: Admit: 2024-01-12

## 2024-01-12 DIAGNOSIS — Z7689 Persons encountering health services in other specified circumstances: Secondary | ICD-10-CM

## 2024-01-12 NOTE — Progress Notes (Signed)
 Visit Date: 01/12/2024  Return to Work Visit - Non Industrial    Haley Perkins    PTP: Divya Kini MD    Diagnosis given by PTP: Rib pain    [facility] - [place]- PACU DEPT  Job title/ Employment Length: RN     SUBJECTIVE  HPI: Off at the end of Aug for 2 weeks due to rib injury. Unsure if she pulled a muscle. Occurred in her home as she was reaching for something.   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 Currently no c/o pain. Salonpas patches really helped relieve the pain . No limitations to ROM.     Medications/Therapies/Interventions Reported:  Warm compress  Ibuprofen 800mg   Lidocaine patches (salonpas)       PLAN    Work Status Full duty 01/13/24     Follow up with PTP as scheduled, sooner as needed for any new onset or worsening symptoms. Follow up with Occupational Health for any change in work status - Call [phone] to speak with our administrative staff and schedule the appointment as needed.    Ames Bugler RN
